# Patient Record
Sex: Female | Born: 1940 | Race: White | Hispanic: No | Marital: Single | Smoking: Current every day smoker
Health system: Southern US, Community
[De-identification: ages and names within clinical notes are randomized; demographics above are authoritative.]

## PROBLEM LIST (undated history)

## (undated) DIAGNOSIS — I509 Heart failure, unspecified: Secondary | ICD-10-CM

## (undated) DIAGNOSIS — I10 Essential (primary) hypertension: Secondary | ICD-10-CM

## (undated) DIAGNOSIS — E079 Disorder of thyroid, unspecified: Secondary | ICD-10-CM

## (undated) DIAGNOSIS — S72009A Fracture of unspecified part of neck of unspecified femur, initial encounter for closed fracture: Secondary | ICD-10-CM

## (undated) DIAGNOSIS — E119 Type 2 diabetes mellitus without complications: Secondary | ICD-10-CM

## (undated) DIAGNOSIS — H539 Unspecified visual disturbance: Secondary | ICD-10-CM

## (undated) HISTORY — PX: ABDOMINAL HYSTERECTOMY: SHX81

## (undated) HISTORY — PX: REPLACEMENT TOTAL KNEE: SUR1224

## (undated) HISTORY — DX: Heart failure, unspecified: I50.9

## (undated) HISTORY — PX: REPLACEMENT TOTAL KNEE BILATERAL: SUR1225

## (undated) HISTORY — DX: Unspecified visual disturbance: H53.9

## (undated) HISTORY — PX: HERNIA REPAIR: SHX51

---

## 2016-10-18 ENCOUNTER — Emergency Department (HOSPITAL_COMMUNITY): Payer: Medicare PPO

## 2016-10-18 ENCOUNTER — Emergency Department (HOSPITAL_COMMUNITY)
Admission: EM | Admit: 2016-10-18 | Discharge: 2016-10-18 | Disposition: A | Discharge status: Home / Self Care | Payer: Medicare PPO | Attending: Emergency Medicine | Admitting: Emergency Medicine

## 2016-10-18 ENCOUNTER — Encounter (HOSPITAL_COMMUNITY): Payer: Self-pay | Admitting: Emergency Medicine

## 2016-10-18 DIAGNOSIS — I10 Essential (primary) hypertension: Secondary | ICD-10-CM | POA: Diagnosis not present

## 2016-10-18 DIAGNOSIS — Y9389 Activity, other specified: Secondary | ICD-10-CM | POA: Diagnosis not present

## 2016-10-18 DIAGNOSIS — S32591D Other specified fracture of right pubis, subsequent encounter for fracture with routine healing: Secondary | ICD-10-CM | POA: Insufficient documentation

## 2016-10-18 DIAGNOSIS — S92515A Nondisplaced fracture of proximal phalanx of left lesser toe(s), initial encounter for closed fracture: Secondary | ICD-10-CM | POA: Diagnosis not present

## 2016-10-18 DIAGNOSIS — S32511D Fracture of superior rim of right pubis, subsequent encounter for fracture with routine healing: Secondary | ICD-10-CM | POA: Diagnosis not present

## 2016-10-18 DIAGNOSIS — Y9289 Other specified places as the place of occurrence of the external cause: Secondary | ICD-10-CM | POA: Insufficient documentation

## 2016-10-18 DIAGNOSIS — E119 Type 2 diabetes mellitus without complications: Secondary | ICD-10-CM | POA: Insufficient documentation

## 2016-10-18 DIAGNOSIS — Z96653 Presence of artificial knee joint, bilateral: Secondary | ICD-10-CM | POA: Insufficient documentation

## 2016-10-18 DIAGNOSIS — F17210 Nicotine dependence, cigarettes, uncomplicated: Secondary | ICD-10-CM | POA: Insufficient documentation

## 2016-10-18 DIAGNOSIS — X58XXXA Exposure to other specified factors, initial encounter: Secondary | ICD-10-CM | POA: Insufficient documentation

## 2016-10-18 DIAGNOSIS — Y999 Unspecified external cause status: Secondary | ICD-10-CM | POA: Insufficient documentation

## 2016-10-18 DIAGNOSIS — S92502A Displaced unspecified fracture of left lesser toe(s), initial encounter for closed fracture: Secondary | ICD-10-CM

## 2016-10-18 DIAGNOSIS — S99922A Unspecified injury of left foot, initial encounter: Secondary | ICD-10-CM | POA: Diagnosis present

## 2016-10-18 HISTORY — DX: Essential (primary) hypertension: I10

## 2016-10-18 HISTORY — DX: Disorder of thyroid, unspecified: E07.9

## 2016-10-18 HISTORY — DX: Fracture of unspecified part of neck of unspecified femur, initial encounter for closed fracture: S72.009A

## 2016-10-18 HISTORY — DX: Type 2 diabetes mellitus without complications: E11.9

## 2016-10-18 MED ORDER — HYDROCODONE-ACETAMINOPHEN 5-325 MG PO TABS
1.0000 | ORAL_TABLET | Freq: Once | ORAL | Status: AC
  Administered 2016-10-18: 1 via ORAL
  Filled 2016-10-18: qty 1

## 2016-10-18 NOTE — ED Triage Notes (Signed)
Pt arrives EMS from home where she was supposed to start physical therapy today for her left hip fracture. Pt noted blueness and coldness at left foot. Blueness noted at top of left foot that does not include distal toes. Left dp found intermittently and much weaker than right dorsal;is pedis pulse.

## 2016-10-18 NOTE — ED Notes (Signed)
Pt verbalized understanding discharge instructions and denies any further needs or questions at this time. VS stable, 

## 2016-10-18 NOTE — ED Provider Notes (Signed)
[facility]EM[facility]ENCY DEPT Provider Note   CSN: [id number] Arrival date & time: 10/18/16  1737     History   Chief Complaint Chief Complaint  Patient presents with  . Foot Pain  . Cold Extremity    HPI Darlene Hernandez is a 75 y.o. female.  75 year old female had fallen 2 weeks ago and was seen and told she had bilateral hairline fractures of her hip. She has been nonweightbearing since then, but has had subsequent falls. Last night, she was noted to have a blue left foot and there was concern for a blood clot so she was told to come here. She states she is taking hydrocodone-acetaminophen for pain limited relief. She has not been able to ambulate. Her daughter states that her left foot was purple last night and is green today. 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   The history is provided by the patient.    Past Medical History:  Diagnosis Date  . Diabetes mellitus without complication (HCC)   . Hip fracture (HCC)   . Hypertension   . Thyroid disease     There are no active problems to display for this patient.   Past Surgical History:  Procedure Laterality Date  . ABDOMINAL HYSTERECTOMY    . HERNIA REPAIR    . REPLACEMENT TOTAL KNEE Right   . REPLACEMENT TOTAL KNEE BILATERAL      OB History    No data available       Home Medications    Prior to Admission medications   Not on File    Family History History reviewed. No pertinent family history.  Social History Social History  Substance Use Topics  . Smoking status: Current Every Day Smoker    Types: E-cigarettes  . Smokeless tobacco: Never Used  . Alcohol use No     Allergies   Penicillins; Sulfa antibiotics; and Prednisone   Review of Systems Review of Systems  All other systems reviewed and are negative.    Physical Exam Updated Vital Signs BP 165/89   Pulse 79   Resp 17   SpO2 92%   Physical Exam  Nursing note and vitals reviewed.  75 year old female, resting comfortably  and in no acute distress. Vital signs are significant for hypertension. Oxygen saturation is 95%, which is normal. Head is normocephalic and atraumatic. PERRLA, EOMI. Oropharynx is clear. Neck is nontender and supple without adenopathy or JVD. Back is nontender and there is no CVA tenderness. Lungs are clear without rales, wheezes, or rhonchi. Chest is nontender. Heart has regular rate and rhythm without murmur. Abdomen is soft, flat, nontender without masses or hepatosplenomegaly and peristalsis is normoactive. Extremities: Ecchymosis and soft tissue swelling is noted over the dorsum of left foot. The left foot is cool to touch but same temperature as the right foot. Capillary refill is delayed to 4 seconds, but this is symmetric compared with the right foot. There is tenderness palpation over the right midfoot. There is pain with palpation over both hips and with passive movement of either hip. Skin is warm and dry without rash. Neurologic: Mental status is normal, cranial nerves are intact, left foot drop is present but no other motor or sensory deficits.  ED Treatments / Results   Radiology Dg Foot Complete Left  Result Date: 10/18/2016 CLINICAL DATA:  Left foot pain. EXAM: LEFT FOOT - COMPLETE 3+ VIEW COMPARISON:  None. FINDINGS: Mildly displaced fractures are seen involving the second, fourth and fifth proximal  phalanges. Mild diffuse osteopenia is noted. No soft tissue abnormality is noted. Joint spaces appear to be intact. IMPRESSION: Mildly displaced second, fourth and fifth proximal phalangeal fractures. Electronically Signed   By: Lupita RaiderJames  Green Jr, M.D.   On: 10/18/2016 19:03   Dg Hips Bilat W Or Wo Pelvis 5 Views  Result Date: 10/18/2016 CLINICAL DATA:  Right hip pain. EXAM: DG HIP (WITH OR WITHOUT PELVIS) 5+V BILAT COMPARISON:  None. FINDINGS: Nondisplaced fractures are seen involving the right superior and inferior pubic rami. Hip joints appear intact. Multilevel degenerative disc  disease is noted in the lumbar spine. IMPRESSION: Nondisplaced right superior and inferior pubic rami fractures. Electronically Signed   By: Lupita RaiderJames  Green Jr, M.D.   On: 10/18/2016 19:07    Procedures Procedures (including critical care time)  Medications Ordered in ED Medications  HYDROcodone-acetaminophen (NORCO/VICODIN) 5-325 MG per tablet 1 tablet (1 tablet Oral Given 10/18/16 1820)     Initial Impression / Assessment and Plan / ED Course  I have reviewed the triage vital signs and the nursing notes.  Pertinent imaging results that were available during my care of the patient were reviewed by me and considered in my medical decision making (see chart for details).  Clinical Course    Left foot findings more suggestive of bruising and possible occult foot fracture then add any vascular injury. Bilateral hip pain. I've tried to find her results on care every where in Epic, but have not been able to find those. I will repeat her hip x-rays today as well as obtain x-ray of her foot.  Hip x-rays actually show nondisplaced fractures of the right superior and inferior pubic rami. Foot x-ray shows fractures of the second, third, fourth toes. These clearly account for her symptoms and findings. Patient is referred to orthopedics for follow-up.  Final Clinical Impressions(s) / ED Diagnoses   Final diagnoses:  Closed fracture of second toe of left foot, initial encounter  Closed fracture of third toe of left foot, initial encounter  Closed fracture of phalanx of left second toe, initial encounter  Closed fracture of right superior pubic ramus with routine healing, subsequent encounter  Closed fracture of right inferior pubic ramus with routine healing, subsequent encounter    New Prescriptions New Prescriptions   No medications on file     Dione Boozeavid Cicely Ortner, MD 10/18/16 2112

## 2016-11-19 DIAGNOSIS — J159 Unspecified bacterial pneumonia: Secondary | ICD-10-CM | POA: Diagnosis not present

## 2016-11-19 DIAGNOSIS — N390 Urinary tract infection, site not specified: Secondary | ICD-10-CM

## 2016-11-19 DIAGNOSIS — I10 Essential (primary) hypertension: Secondary | ICD-10-CM

## 2016-11-19 DIAGNOSIS — R296 Repeated falls: Secondary | ICD-10-CM

## 2016-11-19 DIAGNOSIS — E119 Type 2 diabetes mellitus without complications: Secondary | ICD-10-CM

## 2016-11-19 DIAGNOSIS — M21371 Foot drop, right foot: Secondary | ICD-10-CM

## 2016-11-19 DIAGNOSIS — E785 Hyperlipidemia, unspecified: Secondary | ICD-10-CM

## 2016-11-19 DIAGNOSIS — K219 Gastro-esophageal reflux disease without esophagitis: Secondary | ICD-10-CM

## 2016-11-19 DIAGNOSIS — I959 Hypotension, unspecified: Secondary | ICD-10-CM | POA: Diagnosis not present

## 2016-11-19 DIAGNOSIS — L89159 Pressure ulcer of sacral region, unspecified stage: Secondary | ICD-10-CM

## 2016-11-20 DIAGNOSIS — J159 Unspecified bacterial pneumonia: Secondary | ICD-10-CM | POA: Diagnosis not present

## 2016-11-20 DIAGNOSIS — N390 Urinary tract infection, site not specified: Secondary | ICD-10-CM | POA: Diagnosis not present

## 2016-11-20 DIAGNOSIS — E8770 Fluid overload, unspecified: Secondary | ICD-10-CM

## 2016-11-20 DIAGNOSIS — M21371 Foot drop, right foot: Secondary | ICD-10-CM | POA: Diagnosis not present

## 2016-11-20 DIAGNOSIS — I959 Hypotension, unspecified: Secondary | ICD-10-CM | POA: Diagnosis not present

## 2016-11-21 DIAGNOSIS — I959 Hypotension, unspecified: Secondary | ICD-10-CM | POA: Diagnosis not present

## 2016-11-21 DIAGNOSIS — N390 Urinary tract infection, site not specified: Secondary | ICD-10-CM | POA: Diagnosis not present

## 2016-11-21 DIAGNOSIS — M21371 Foot drop, right foot: Secondary | ICD-10-CM | POA: Diagnosis not present

## 2016-11-21 DIAGNOSIS — J159 Unspecified bacterial pneumonia: Secondary | ICD-10-CM | POA: Diagnosis not present

## 2016-11-24 ENCOUNTER — Ambulatory Visit: Payer: Medicare PPO | Admitting: Neurology

## 2016-12-09 DIAGNOSIS — R600 Localized edema: Secondary | ICD-10-CM | POA: Diagnosis not present

## 2016-12-09 DIAGNOSIS — I10 Essential (primary) hypertension: Secondary | ICD-10-CM

## 2016-12-09 DIAGNOSIS — R9431 Abnormal electrocardiogram [ECG] [EKG]: Secondary | ICD-10-CM

## 2016-12-09 DIAGNOSIS — E119 Type 2 diabetes mellitus without complications: Secondary | ICD-10-CM

## 2016-12-09 DIAGNOSIS — K219 Gastro-esophageal reflux disease without esophagitis: Secondary | ICD-10-CM

## 2016-12-09 DIAGNOSIS — E785 Hyperlipidemia, unspecified: Secondary | ICD-10-CM

## 2016-12-09 DIAGNOSIS — I509 Heart failure, unspecified: Secondary | ICD-10-CM | POA: Diagnosis not present

## 2016-12-10 DIAGNOSIS — R600 Localized edema: Secondary | ICD-10-CM | POA: Diagnosis not present

## 2016-12-10 DIAGNOSIS — R9431 Abnormal electrocardiogram [ECG] [EKG]: Secondary | ICD-10-CM | POA: Diagnosis not present

## 2016-12-10 DIAGNOSIS — I509 Heart failure, unspecified: Secondary | ICD-10-CM | POA: Diagnosis not present

## 2016-12-10 DIAGNOSIS — E119 Type 2 diabetes mellitus without complications: Secondary | ICD-10-CM | POA: Diagnosis not present

## 2016-12-11 DIAGNOSIS — R600 Localized edema: Secondary | ICD-10-CM | POA: Diagnosis not present

## 2016-12-11 DIAGNOSIS — E119 Type 2 diabetes mellitus without complications: Secondary | ICD-10-CM | POA: Diagnosis not present

## 2016-12-11 DIAGNOSIS — I509 Heart failure, unspecified: Secondary | ICD-10-CM | POA: Diagnosis not present

## 2016-12-11 DIAGNOSIS — R9431 Abnormal electrocardiogram [ECG] [EKG]: Secondary | ICD-10-CM | POA: Diagnosis not present

## 2016-12-16 ENCOUNTER — Ambulatory Visit: Payer: Medicare PPO | Admitting: Neurology

## 2017-01-13 ENCOUNTER — Ambulatory Visit (INDEPENDENT_AMBULATORY_CARE_PROVIDER_SITE_OTHER): Payer: Medicare PPO | Admitting: Neurology

## 2017-01-13 ENCOUNTER — Encounter: Payer: Self-pay | Admitting: Neurology

## 2017-01-13 DIAGNOSIS — M21372 Foot drop, left foot: Secondary | ICD-10-CM

## 2017-01-13 DIAGNOSIS — R296 Repeated falls: Secondary | ICD-10-CM | POA: Diagnosis not present

## 2017-01-13 DIAGNOSIS — G629 Polyneuropathy, unspecified: Secondary | ICD-10-CM

## 2017-01-13 DIAGNOSIS — R531 Weakness: Secondary | ICD-10-CM

## 2017-01-13 DIAGNOSIS — M21371 Foot drop, right foot: Secondary | ICD-10-CM | POA: Diagnosis not present

## 2017-01-13 DIAGNOSIS — R200 Anesthesia of skin: Secondary | ICD-10-CM | POA: Diagnosis not present
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
## 2017-01-18 LAB — MULTIPLE MYELOMA PANEL, SERUM
ALBUMIN SERPL ELPH-MCNC: 3.7 g/dL (ref 2.9–4.4)
ALPHA 1: 0.3 g/dL (ref 0.0–0.4)
Albumin/Glob SerPl: 1.2 (ref 0.7–1.7)
Alpha2 Glob SerPl Elph-Mcnc: 0.8 g/dL (ref 0.4–1.0)
B-Globulin SerPl Elph-Mcnc: 1.2 g/dL (ref 0.7–1.3)
Gamma Glob SerPl Elph-Mcnc: 1 g/dL (ref 0.4–1.8)
Globulin, Total: 3.2 (ref 2.2–3.9)
IGA/IMMUNOGLOBULIN A, SERUM: 319 mg/dL (ref 64–422)
IGM (IMMUNOGLOBULIN M), SRM: 153 mg/dL (ref 26–217)
IgG (Immunoglobin G), Serum: 1075 mg/dL (ref 700–1600)
TOTAL PROTEIN: 6.9 g/dL (ref 6.0–8.5)

## 2017-01-18 LAB — ENA+DNA/DS+SJORGEN'S
ENA RNP Ab: 1.3 AI — ABNORMAL HIGH (ref 0.0–0.9)
ENA SSB (LA) Ab: 1.5 AI — ABNORMAL HIGH (ref 0.0–0.9)
dsDNA Ab: <1 IU/mL (ref 0–9)

## 2017-01-18 LAB — ANA W/REFLEX: ANA: POSITIVE — AB

## 2017-01-18 LAB — SEDIMENTATION RATE: SED RATE: 23 mm/h (ref 0–40)

## 2017-01-18 LAB — VITAMIN B12: VITAMIN B 12: 787 pg/mL (ref 232–1245)

## 2017-01-18 LAB — RHEUMATOID FACTOR: Rhuematoid fact SerPl-aCnc: <10 IU/mL (ref 0.0–13.9)

## 2017-01-19 ENCOUNTER — Ambulatory Visit
Admission: RE | Admit: 2017-01-19 | Discharge: 2017-01-19 | Disposition: A | Discharge status: Home / Self Care | Payer: Medicare PPO | Source: Ambulatory Visit | Attending: Neurology | Admitting: Neurology

## 2017-01-19 DIAGNOSIS — G629 Polyneuropathy, unspecified: Secondary | ICD-10-CM

## 2017-01-26 ENCOUNTER — Telehealth: Payer: Self-pay | Admitting: *Deleted

## 2017-01-26 DIAGNOSIS — R899 Unspecified abnormal finding in specimens from other organs, systems and tissues: Secondary | ICD-10-CM

## 2017-01-26 DIAGNOSIS — G629 Polyneuropathy, unspecified: Secondary | ICD-10-CM

## 2017-01-26 NOTE — Telephone Encounter (Signed)
I have spoken with Darlene Hernandez this morning and per RAS, reviewed MRI an lab results as below.  She verbalized understanding of same, is agreeable to rheumatology consult.  Order in EPIC/fim

## 2017-01-26 NOTE — Telephone Encounter (Signed)
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 2)   the labwork showed that she might have a rheumatologic disorder and that might be causing her neuropathy. I would like her to see a rheumatologist

## 2017-01-27 NOTE — Telephone Encounter (Signed)
Noted Referral has been sent.  °

## 2017-02-21 ENCOUNTER — Ambulatory Visit (INDEPENDENT_AMBULATORY_CARE_PROVIDER_SITE_OTHER): Payer: Self-pay | Admitting: Neurology

## 2017-02-21 ENCOUNTER — Encounter (INDEPENDENT_AMBULATORY_CARE_PROVIDER_SITE_OTHER): Payer: Self-pay

## 2017-02-21 ENCOUNTER — Encounter: Payer: Self-pay | Admitting: Neurology

## 2017-02-21 ENCOUNTER — Ambulatory Visit (INDEPENDENT_AMBULATORY_CARE_PROVIDER_SITE_OTHER): Payer: Medicare PPO | Admitting: Neurology

## 2017-02-21 DIAGNOSIS — M21371 Foot drop, right foot: Secondary | ICD-10-CM | POA: Diagnosis not present

## 2017-02-21 DIAGNOSIS — M21372 Foot drop, left foot: Secondary | ICD-10-CM

## 2017-02-21 DIAGNOSIS — G629 Polyneuropathy, unspecified: Secondary | ICD-10-CM

## 2017-02-21 NOTE — Progress Notes (Signed)
Please refer to EMG and nerve conduction study procedure note. 

## 2017-02-21 NOTE — Procedures (Signed)
     HISTORY:  Darlene Hernandez is a 76 year old white female with a progressive gait disorder associated with numbness and weakness of both legs, and associated with bilateral foot drops. The patient is evaluated for possible peripheral neuropathy. She has known severe spinal stenosis as well at the L3-4 level.  NERVE CONDUCTION STUDIES:  Nerve conduction studies were performed on the left upper extremity. The distal motor latency for the left median nerve was prolonged, with a normal motor amplitude. The distal motor latency for the left ulnar nerve was prolonged with a normal motor amplitude. The nerve conduction velocities for the left median and ulnar nerves were normal. The sensory latency for the left ulnar nerve was unobtainable, and was prolonged for the left median nerve. The F wave latencies for the left median and ulnar nerves were prolonged.  Nerve conduction studies were performed on both lower extremities. The studies of the peroneal and posterior tibial nerves were unobtainable bilaterally. The sural and peroneal sensory latencies were unobtainable bilaterally. The H reflex latencies were unobtainable bilaterally.  EMG STUDIES:  EMG study was performed on the left lower extremity:  The tibialis anterior muscle reveals no voluntary recruitment. No fibrillations or positive waves were seen. The peroneus tertius muscle reveals no voluntary recruitment. No fibrillations or positive waves were seen. The medial gastrocnemius muscle reveals no voluntary full recruitment. No fibrillations or positive waves were seen. The vastus lateralis muscle reveals 2 to 5K motor units with decreased recruitment. No fibrillations or positive waves were seen. The iliopsoas muscle reveals 2 to 5K motor units with decreased recruitment. No fibrillations or positive waves were seen. The biceps femoris muscle (long head) reveals 2 to 4K motor units with slightly decreased recruitment. No fibrillations or  positive waves were seen. The lumbosacral paraspinal muscles were tested at 3 levels, and revealed no abnormalities of insertional activity at all 3 levels tested. There was good relaxation.   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 EMG evaluation of the left lower extremity shows no viable muscle tissue in muscles tested below the knee, with chronic stable changes above the knee. The study is consistent with a severe end-stage peripheral neuropathy, some of the chronic changes seen more proximally may be related to the lumbosacral spinal stenosis affecting the L3 and L4 nerve roots. No acute denervation was seen.  [name]. Keith Jethro Radke MD 02/21/2017 4:02 PM  [facility] [address] [place], [place] [id number]  Phone [phone] Fax [phone]

## 2017-02-21 NOTE — Progress Notes (Signed)
A prescription was given today for bilateral AFO braces, the patient is being followed through the [facility] [place]. The telephone number there is [phone]. The fax number is [phone].   The report of the EMG study and the prescription for the AFO brace will be faxed to the [facility].

## 2017-02-24 ENCOUNTER — Telehealth: Payer: Self-pay | Admitting: *Deleted

## 2017-02-24 NOTE — Telephone Encounter (Signed)
I have spoken with [name] this afternoon, and per RAS, reviewed results with her as below.  She verbalized understanding of same, sts. she has pending appt. with rheumatology in April./fim

## 2017-02-24 NOTE — Telephone Encounter (Signed)
-----   Message from Asa Lenteichard A Sater, MD sent at 02/22/2017  9:37 AM EDT ----- Dr. Anne HahnWillis did her EMG yesterday. She has a very severe polyneuropathy.     It looks like he gave her a prescription for AFO braces (for [facility]) which may help. I did want her to see rheumatology as some of her lab work was abnormal and that could be playing a role (it looks like you sent in the referral 01/26/2017, hopefully she is scheduled with them)

## 2017-03-24 ENCOUNTER — Telehealth: Payer: Self-pay | Admitting: Neurology

## 2017-03-24 NOTE — Telephone Encounter (Signed)
Noted/fim 

## 2017-03-24 NOTE — Telephone Encounter (Signed)
Patient has Cx. Her apt with [facility]. [phone].

## 2017-08-04 DIAGNOSIS — M21371 Foot drop, right foot: Secondary | ICD-10-CM | POA: Diagnosis not present

## 2017-08-04 DIAGNOSIS — M21372 Foot drop, left foot: Secondary | ICD-10-CM | POA: Diagnosis not present

## 2017-08-12 DIAGNOSIS — I5043 Acute on chronic combined systolic (congestive) and diastolic (congestive) heart failure: Secondary | ICD-10-CM | POA: Diagnosis not present

## 2017-08-16 DIAGNOSIS — I10 Essential (primary) hypertension: Secondary | ICD-10-CM | POA: Diagnosis not present

## 2017-08-16 DIAGNOSIS — E0590 Thyrotoxicosis, unspecified without thyrotoxic crisis or storm: Secondary | ICD-10-CM | POA: Diagnosis not present

## 2017-08-16 DIAGNOSIS — J309 Allergic rhinitis, unspecified: Secondary | ICD-10-CM | POA: Diagnosis not present

## 2017-08-16 DIAGNOSIS — E119 Type 2 diabetes mellitus without complications: Secondary | ICD-10-CM | POA: Diagnosis not present

## 2017-08-16 DIAGNOSIS — E785 Hyperlipidemia, unspecified: Secondary | ICD-10-CM | POA: Diagnosis not present

## 2017-08-16 DIAGNOSIS — Z79899 Other long term (current) drug therapy: Secondary | ICD-10-CM | POA: Diagnosis not present

## 2017-08-16 DIAGNOSIS — Z6830 Body mass index (BMI) 30.0-30.9, adult: Secondary | ICD-10-CM | POA: Diagnosis not present

## 2017-08-16 DIAGNOSIS — G4700 Insomnia, unspecified: Secondary | ICD-10-CM | POA: Diagnosis not present

## 2017-09-11 DIAGNOSIS — I5043 Acute on chronic combined systolic (congestive) and diastolic (congestive) heart failure: Secondary | ICD-10-CM | POA: Diagnosis not present

## 2017-10-12 DIAGNOSIS — I5043 Acute on chronic combined systolic (congestive) and diastolic (congestive) heart failure: Secondary | ICD-10-CM | POA: Diagnosis not present

## 2017-11-11 DIAGNOSIS — I5043 Acute on chronic combined systolic (congestive) and diastolic (congestive) heart failure: Secondary | ICD-10-CM | POA: Diagnosis not present

## 2017-11-15 DIAGNOSIS — R6889 Other general symptoms and signs: Secondary | ICD-10-CM | POA: Diagnosis not present

## 2017-11-15 DIAGNOSIS — Z23 Encounter for immunization: Secondary | ICD-10-CM | POA: Diagnosis not present

## 2017-11-15 DIAGNOSIS — F419 Anxiety disorder, unspecified: Secondary | ICD-10-CM | POA: Diagnosis not present

## 2017-11-15 DIAGNOSIS — J449 Chronic obstructive pulmonary disease, unspecified: Secondary | ICD-10-CM | POA: Diagnosis not present

## 2017-11-15 DIAGNOSIS — E119 Type 2 diabetes mellitus without complications: Secondary | ICD-10-CM | POA: Diagnosis not present

## 2017-11-15 DIAGNOSIS — G4700 Insomnia, unspecified: Secondary | ICD-10-CM | POA: Diagnosis not present

## 2017-11-15 DIAGNOSIS — E785 Hyperlipidemia, unspecified: Secondary | ICD-10-CM | POA: Diagnosis not present

## 2017-11-15 DIAGNOSIS — Z79899 Other long term (current) drug therapy: Secondary | ICD-10-CM | POA: Diagnosis not present

## 2017-11-15 DIAGNOSIS — E0590 Thyrotoxicosis, unspecified without thyrotoxic crisis or storm: Secondary | ICD-10-CM | POA: Diagnosis not present

## 2017-12-12 DIAGNOSIS — I5043 Acute on chronic combined systolic (congestive) and diastolic (congestive) heart failure: Secondary | ICD-10-CM | POA: Diagnosis not present

## 2018-01-04 IMAGING — DX DG HIP (WITH OR WITHOUT PELVIS) 5+V BILAT
6 series · 6 of 6 positions shown · non-contrast
Comparison: None.

CLINICAL DATA: Right hip pain.

EXAM:
DG HIP (WITH OR WITHOUT PELVIS) 5+V BILAT

[pelvis ap (1 of 2)]
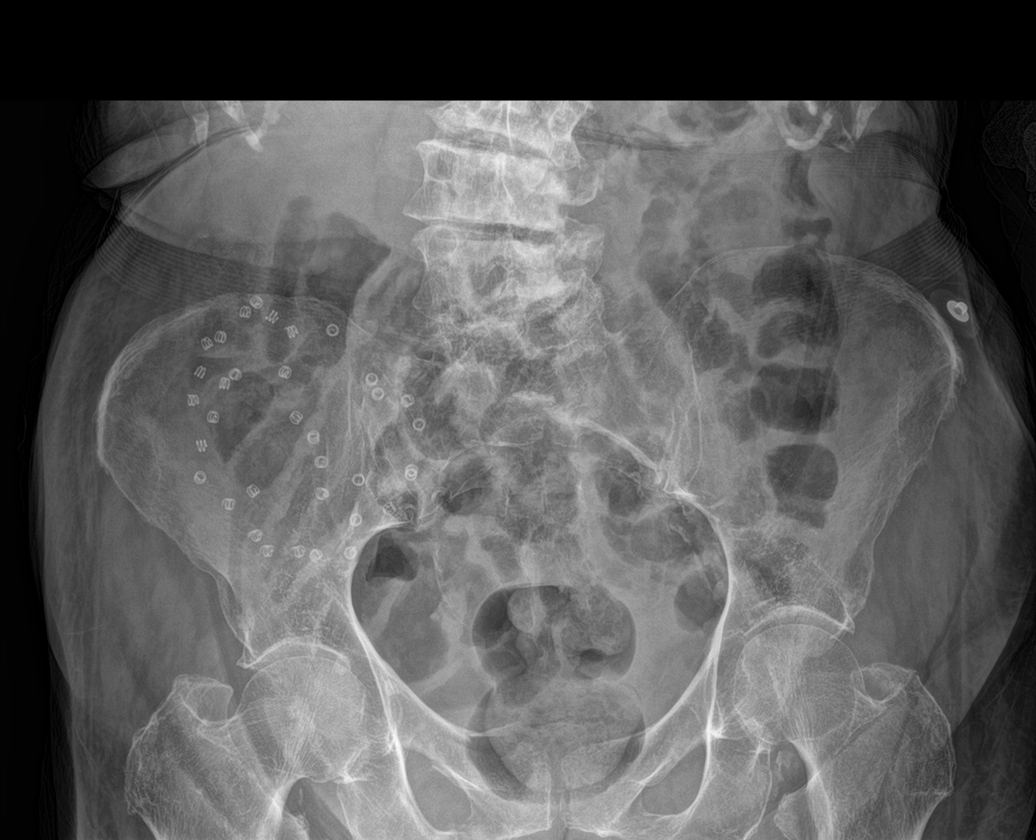

[hip ap (1 of 2)]
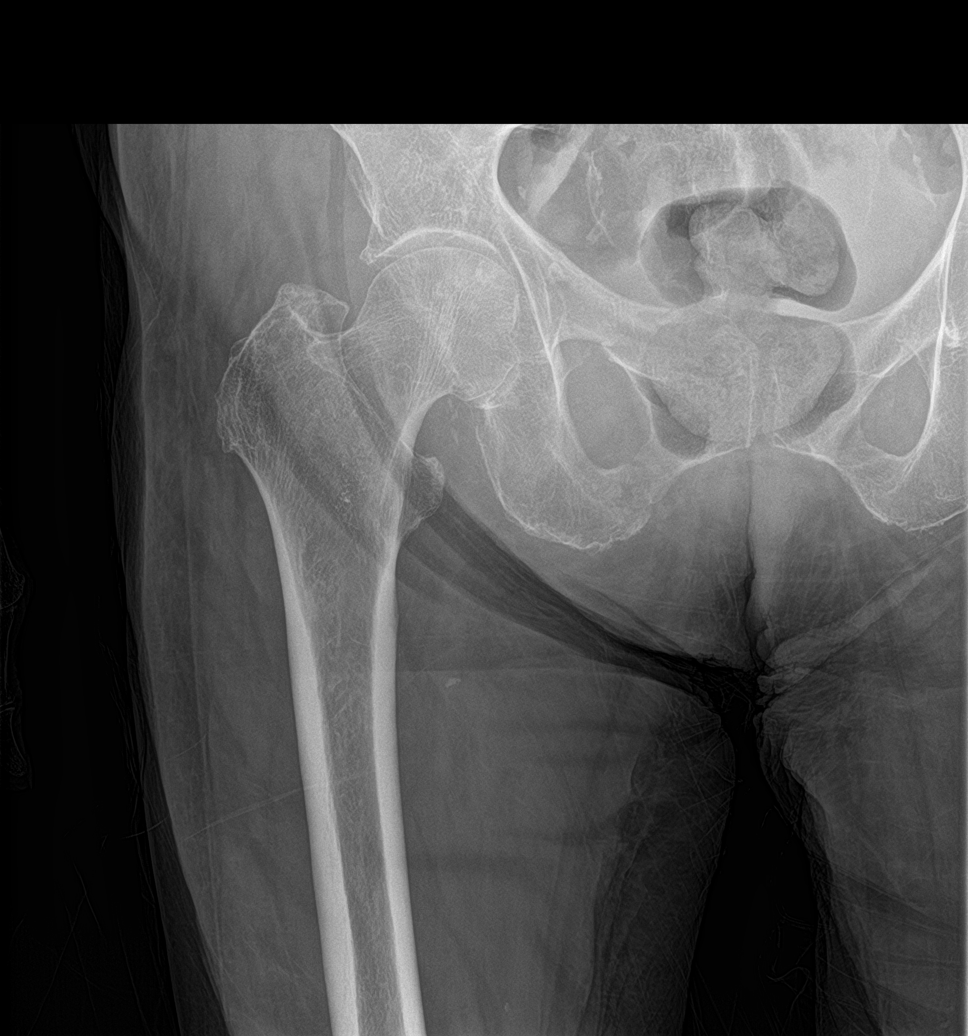

[hip frog leg (1 of 2)]
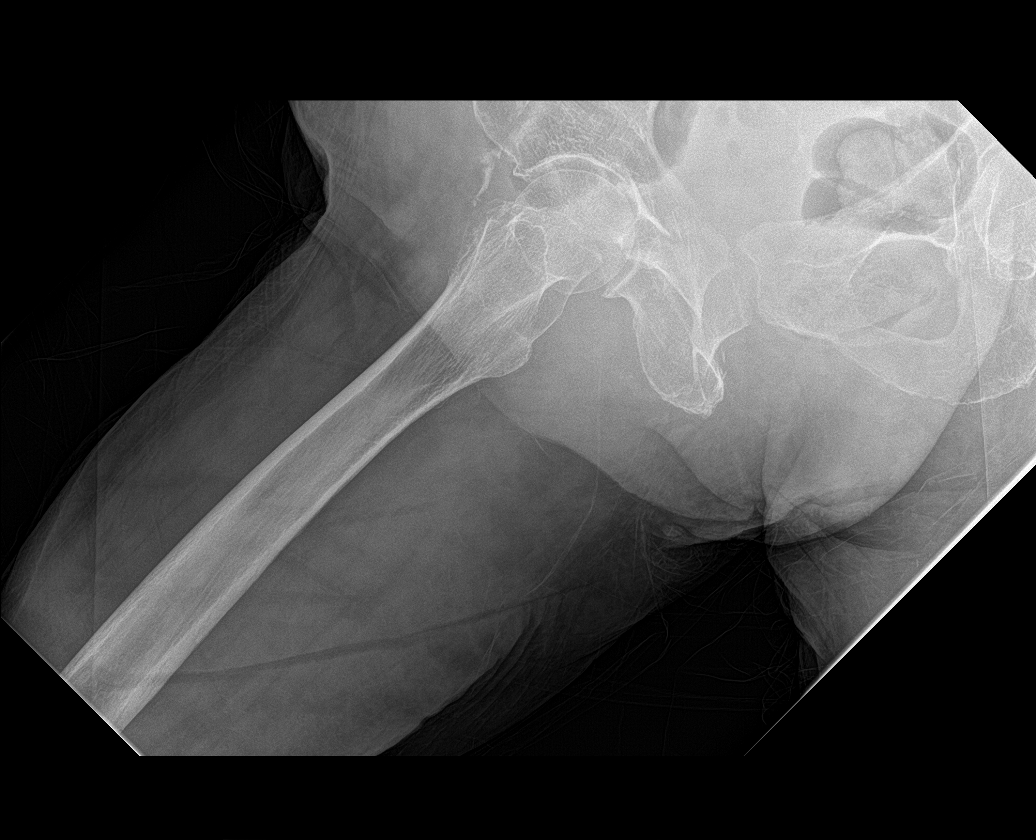

[hip ap (2 of 2)]
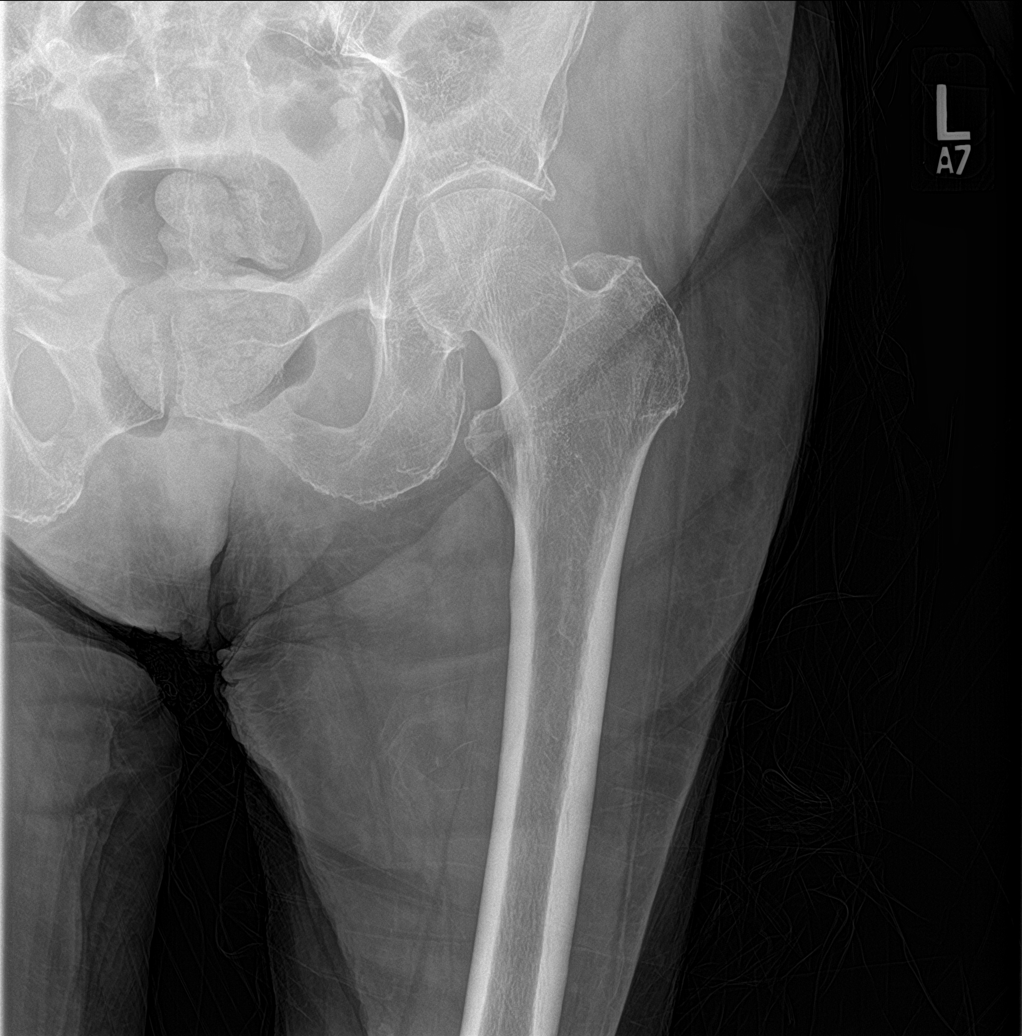

[hip frog leg (2 of 2)]
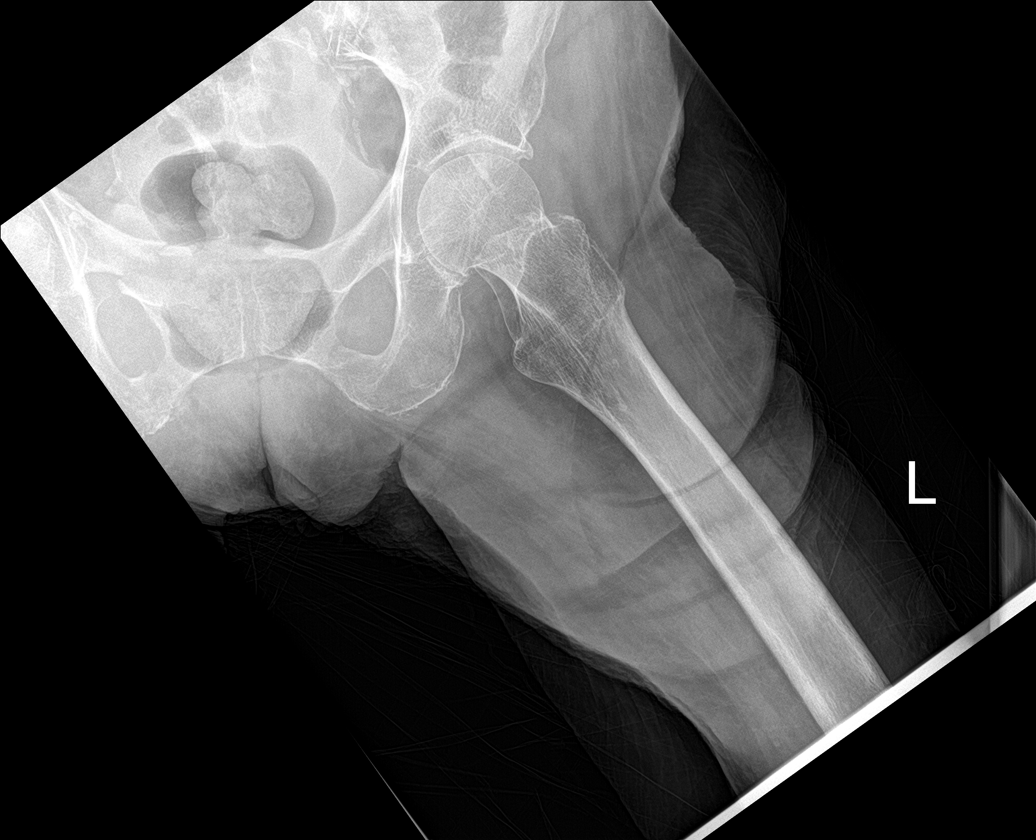

[pelvis ap (2 of 2)]
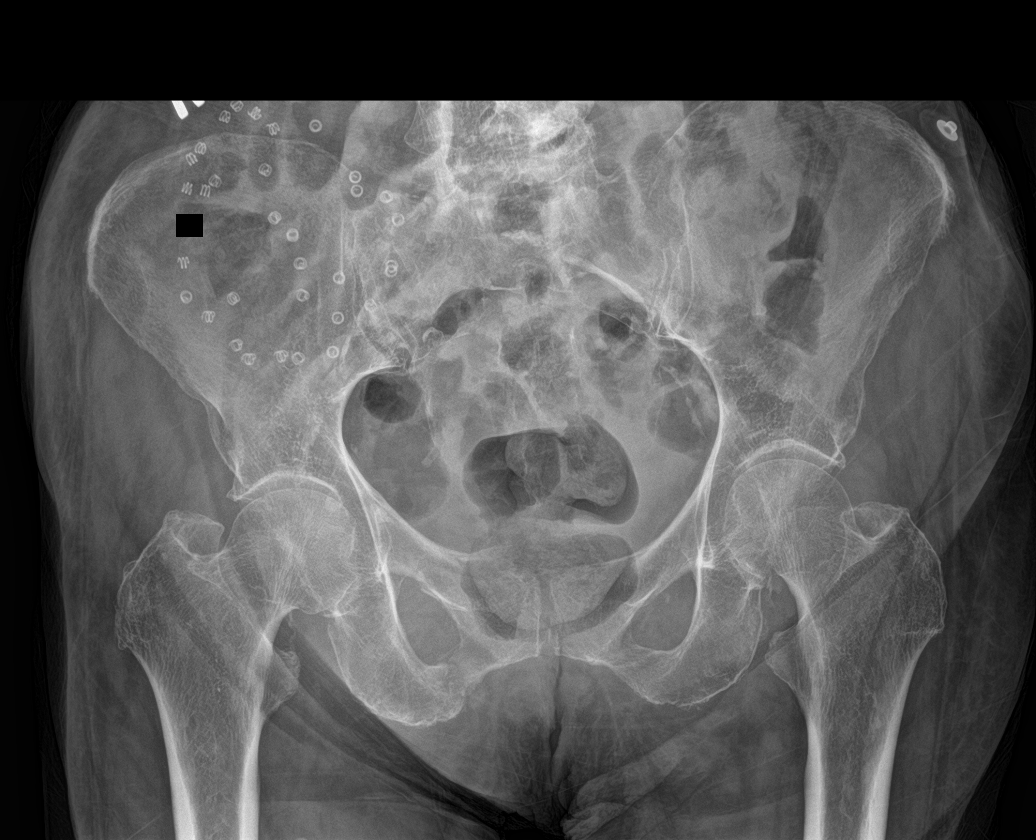

[6 of 6 positions shown; findings below may reference images not displayed]

FINDINGS: Nondisplaced fractures are seen involving the right superior and
inferior pubic rami. Hip joints appear intact. Multilevel
degenerative disc disease is noted in the lumbar spine.
IMPRESSION: Nondisplaced right superior and inferior pubic rami fractures.

## 2018-01-12 DIAGNOSIS — I5043 Acute on chronic combined systolic (congestive) and diastolic (congestive) heart failure: Secondary | ICD-10-CM | POA: Diagnosis not present

## 2018-01-17 DIAGNOSIS — H25813 Combined forms of age-related cataract, bilateral: Secondary | ICD-10-CM | POA: Diagnosis not present

## 2018-01-17 DIAGNOSIS — H35363 Drusen (degenerative) of macula, bilateral: Secondary | ICD-10-CM | POA: Diagnosis not present

## 2018-02-09 DIAGNOSIS — I5043 Acute on chronic combined systolic (congestive) and diastolic (congestive) heart failure: Secondary | ICD-10-CM | POA: Diagnosis not present

## 2018-03-01 DIAGNOSIS — E0590 Thyrotoxicosis, unspecified without thyrotoxic crisis or storm: Secondary | ICD-10-CM | POA: Diagnosis not present

## 2018-03-01 DIAGNOSIS — E785 Hyperlipidemia, unspecified: Secondary | ICD-10-CM | POA: Diagnosis not present

## 2018-03-01 DIAGNOSIS — Z6833 Body mass index (BMI) 33.0-33.9, adult: Secondary | ICD-10-CM | POA: Diagnosis not present

## 2018-03-01 DIAGNOSIS — E119 Type 2 diabetes mellitus without complications: Secondary | ICD-10-CM | POA: Diagnosis not present

## 2018-03-01 DIAGNOSIS — J449 Chronic obstructive pulmonary disease, unspecified: Secondary | ICD-10-CM | POA: Diagnosis not present

## 2018-03-01 DIAGNOSIS — I10 Essential (primary) hypertension: Secondary | ICD-10-CM | POA: Diagnosis not present

## 2018-03-01 DIAGNOSIS — Z1331 Encounter for screening for depression: Secondary | ICD-10-CM | POA: Diagnosis not present

## 2018-03-01 DIAGNOSIS — Z79899 Other long term (current) drug therapy: Secondary | ICD-10-CM | POA: Diagnosis not present

## 2018-03-12 DIAGNOSIS — I5043 Acute on chronic combined systolic (congestive) and diastolic (congestive) heart failure: Secondary | ICD-10-CM | POA: Diagnosis not present

## 2018-04-11 DIAGNOSIS — I5043 Acute on chronic combined systolic (congestive) and diastolic (congestive) heart failure: Secondary | ICD-10-CM | POA: Diagnosis not present

## 2018-05-12 DIAGNOSIS — I5043 Acute on chronic combined systolic (congestive) and diastolic (congestive) heart failure: Secondary | ICD-10-CM | POA: Diagnosis not present

## 2018-06-11 DIAGNOSIS — I5043 Acute on chronic combined systolic (congestive) and diastolic (congestive) heart failure: Secondary | ICD-10-CM | POA: Diagnosis not present

## 2018-07-12 DIAGNOSIS — I5043 Acute on chronic combined systolic (congestive) and diastolic (congestive) heart failure: Secondary | ICD-10-CM | POA: Diagnosis not present

## 2018-07-17 DIAGNOSIS — H35363 Drusen (degenerative) of macula, bilateral: Secondary | ICD-10-CM | POA: Diagnosis not present

## 2018-07-17 DIAGNOSIS — H25813 Combined forms of age-related cataract, bilateral: Secondary | ICD-10-CM | POA: Diagnosis not present

## 2018-07-17 DIAGNOSIS — H40013 Open angle with borderline findings, low risk, bilateral: Secondary | ICD-10-CM | POA: Diagnosis not present

## 2018-08-31 DIAGNOSIS — Z79899 Other long term (current) drug therapy: Secondary | ICD-10-CM | POA: Diagnosis not present

## 2018-08-31 DIAGNOSIS — Z6833 Body mass index (BMI) 33.0-33.9, adult: Secondary | ICD-10-CM | POA: Diagnosis not present

## 2018-08-31 DIAGNOSIS — E785 Hyperlipidemia, unspecified: Secondary | ICD-10-CM | POA: Diagnosis not present

## 2018-08-31 DIAGNOSIS — J440 Chronic obstructive pulmonary disease with acute lower respiratory infection: Secondary | ICD-10-CM | POA: Diagnosis not present

## 2018-08-31 DIAGNOSIS — I10 Essential (primary) hypertension: Secondary | ICD-10-CM | POA: Diagnosis not present

## 2018-08-31 DIAGNOSIS — R0602 Shortness of breath: Secondary | ICD-10-CM | POA: Diagnosis not present

## 2018-08-31 DIAGNOSIS — E559 Vitamin D deficiency, unspecified: Secondary | ICD-10-CM | POA: Diagnosis not present

## 2018-08-31 DIAGNOSIS — R05 Cough: Secondary | ICD-10-CM | POA: Diagnosis not present

## 2018-08-31 DIAGNOSIS — Z1339 Encounter for screening examination for other mental health and behavioral disorders: Secondary | ICD-10-CM | POA: Diagnosis not present

## 2018-08-31 DIAGNOSIS — M21371 Foot drop, right foot: Secondary | ICD-10-CM | POA: Diagnosis not present

## 2018-08-31 DIAGNOSIS — G629 Polyneuropathy, unspecified: Secondary | ICD-10-CM | POA: Diagnosis not present

## 2018-08-31 DIAGNOSIS — Z9181 History of falling: Secondary | ICD-10-CM | POA: Diagnosis not present

## 2018-08-31 DIAGNOSIS — K219 Gastro-esophageal reflux disease without esophagitis: Secondary | ICD-10-CM | POA: Diagnosis not present

## 2018-08-31 DIAGNOSIS — Z139 Encounter for screening, unspecified: Secondary | ICD-10-CM | POA: Diagnosis not present

## 2018-09-06 DIAGNOSIS — I87311 Chronic venous hypertension (idiopathic) with ulcer of right lower extremity: Secondary | ICD-10-CM | POA: Diagnosis not present

## 2018-09-06 DIAGNOSIS — L97312 Non-pressure chronic ulcer of right ankle with fat layer exposed: Secondary | ICD-10-CM | POA: Diagnosis not present

## 2018-09-06 DIAGNOSIS — I87302 Chronic venous hypertension (idiopathic) without complications of left lower extremity: Secondary | ICD-10-CM | POA: Diagnosis not present

## 2018-09-06 DIAGNOSIS — J449 Chronic obstructive pulmonary disease, unspecified: Secondary | ICD-10-CM | POA: Diagnosis not present

## 2018-09-06 DIAGNOSIS — I509 Heart failure, unspecified: Secondary | ICD-10-CM | POA: Diagnosis not present

## 2018-09-06 DIAGNOSIS — L97812 Non-pressure chronic ulcer of other part of right lower leg with fat layer exposed: Secondary | ICD-10-CM | POA: Diagnosis not present

## 2018-09-06 DIAGNOSIS — I119 Hypertensive heart disease without heart failure: Secondary | ICD-10-CM | POA: Diagnosis not present

## 2018-09-06 DIAGNOSIS — G629 Polyneuropathy, unspecified: Secondary | ICD-10-CM | POA: Diagnosis not present

## 2018-09-06 DIAGNOSIS — Z87891 Personal history of nicotine dependence: Secondary | ICD-10-CM | POA: Diagnosis not present

## 2018-09-07 DIAGNOSIS — Z23 Encounter for immunization: Secondary | ICD-10-CM | POA: Diagnosis not present

## 2018-09-07 DIAGNOSIS — F419 Anxiety disorder, unspecified: Secondary | ICD-10-CM | POA: Diagnosis not present

## 2018-09-07 DIAGNOSIS — N952 Postmenopausal atrophic vaginitis: Secondary | ICD-10-CM | POA: Diagnosis not present

## 2018-09-07 DIAGNOSIS — Z6833 Body mass index (BMI) 33.0-33.9, adult: Secondary | ICD-10-CM | POA: Diagnosis not present

## 2018-09-07 DIAGNOSIS — I10 Essential (primary) hypertension: Secondary | ICD-10-CM | POA: Diagnosis not present

## 2018-09-07 DIAGNOSIS — J309 Allergic rhinitis, unspecified: Secondary | ICD-10-CM | POA: Diagnosis not present

## 2018-09-07 DIAGNOSIS — J449 Chronic obstructive pulmonary disease, unspecified: Secondary | ICD-10-CM | POA: Diagnosis not present

## 2018-09-07 DIAGNOSIS — E559 Vitamin D deficiency, unspecified: Secondary | ICD-10-CM | POA: Diagnosis not present

## 2018-09-07 DIAGNOSIS — L97919 Non-pressure chronic ulcer of unspecified part of right lower leg with unspecified severity: Secondary | ICD-10-CM | POA: Diagnosis not present

## 2018-09-11 DIAGNOSIS — I87311 Chronic venous hypertension (idiopathic) with ulcer of right lower extremity: Secondary | ICD-10-CM | POA: Diagnosis not present

## 2018-09-11 DIAGNOSIS — L97312 Non-pressure chronic ulcer of right ankle with fat layer exposed: Secondary | ICD-10-CM | POA: Diagnosis not present

## 2018-09-11 DIAGNOSIS — I872 Venous insufficiency (chronic) (peripheral): Secondary | ICD-10-CM | POA: Diagnosis not present

## 2018-09-11 DIAGNOSIS — L97812 Non-pressure chronic ulcer of other part of right lower leg with fat layer exposed: Secondary | ICD-10-CM | POA: Diagnosis not present

## 2018-09-22 DIAGNOSIS — I87311 Chronic venous hypertension (idiopathic) with ulcer of right lower extremity: Secondary | ICD-10-CM | POA: Diagnosis not present

## 2018-09-22 DIAGNOSIS — J449 Chronic obstructive pulmonary disease, unspecified: Secondary | ICD-10-CM | POA: Diagnosis not present

## 2018-09-22 DIAGNOSIS — L97812 Non-pressure chronic ulcer of other part of right lower leg with fat layer exposed: Secondary | ICD-10-CM | POA: Diagnosis not present

## 2018-09-22 DIAGNOSIS — I87302 Chronic venous hypertension (idiopathic) without complications of left lower extremity: Secondary | ICD-10-CM | POA: Diagnosis not present

## 2018-09-22 DIAGNOSIS — I10 Essential (primary) hypertension: Secondary | ICD-10-CM | POA: Diagnosis not present

## 2018-09-22 DIAGNOSIS — L97312 Non-pressure chronic ulcer of right ankle with fat layer exposed: Secondary | ICD-10-CM | POA: Diagnosis not present

## 2018-09-29 DIAGNOSIS — I87311 Chronic venous hypertension (idiopathic) with ulcer of right lower extremity: Secondary | ICD-10-CM | POA: Diagnosis not present

## 2018-09-29 DIAGNOSIS — I10 Essential (primary) hypertension: Secondary | ICD-10-CM | POA: Diagnosis not present

## 2018-09-29 DIAGNOSIS — J449 Chronic obstructive pulmonary disease, unspecified: Secondary | ICD-10-CM | POA: Diagnosis not present

## 2018-09-29 DIAGNOSIS — L97812 Non-pressure chronic ulcer of other part of right lower leg with fat layer exposed: Secondary | ICD-10-CM | POA: Diagnosis not present

## 2018-09-29 DIAGNOSIS — L97312 Non-pressure chronic ulcer of right ankle with fat layer exposed: Secondary | ICD-10-CM | POA: Diagnosis not present

## 2018-09-29 DIAGNOSIS — I87313 Chronic venous hypertension (idiopathic) with ulcer of bilateral lower extremity: Secondary | ICD-10-CM | POA: Diagnosis not present

## 2018-09-29 DIAGNOSIS — I87302 Chronic venous hypertension (idiopathic) without complications of left lower extremity: Secondary | ICD-10-CM | POA: Diagnosis not present

## 2018-10-06 DIAGNOSIS — I87311 Chronic venous hypertension (idiopathic) with ulcer of right lower extremity: Secondary | ICD-10-CM | POA: Diagnosis not present

## 2018-10-06 DIAGNOSIS — L97812 Non-pressure chronic ulcer of other part of right lower leg with fat layer exposed: Secondary | ICD-10-CM | POA: Diagnosis not present

## 2018-10-06 DIAGNOSIS — L97312 Non-pressure chronic ulcer of right ankle with fat layer exposed: Secondary | ICD-10-CM | POA: Diagnosis not present

## 2018-10-06 DIAGNOSIS — I87302 Chronic venous hypertension (idiopathic) without complications of left lower extremity: Secondary | ICD-10-CM | POA: Diagnosis not present

## 2018-10-06 DIAGNOSIS — J449 Chronic obstructive pulmonary disease, unspecified: Secondary | ICD-10-CM | POA: Diagnosis not present

## 2018-10-06 DIAGNOSIS — I10 Essential (primary) hypertension: Secondary | ICD-10-CM | POA: Diagnosis not present

## 2018-10-13 DIAGNOSIS — J449 Chronic obstructive pulmonary disease, unspecified: Secondary | ICD-10-CM | POA: Diagnosis not present

## 2018-10-13 DIAGNOSIS — L97312 Non-pressure chronic ulcer of right ankle with fat layer exposed: Secondary | ICD-10-CM | POA: Diagnosis not present

## 2018-10-13 DIAGNOSIS — I87302 Chronic venous hypertension (idiopathic) without complications of left lower extremity: Secondary | ICD-10-CM | POA: Diagnosis not present

## 2018-10-13 DIAGNOSIS — I10 Essential (primary) hypertension: Secondary | ICD-10-CM | POA: Diagnosis not present

## 2018-10-13 DIAGNOSIS — I87311 Chronic venous hypertension (idiopathic) with ulcer of right lower extremity: Secondary | ICD-10-CM | POA: Diagnosis not present

## 2018-10-20 DIAGNOSIS — L97312 Non-pressure chronic ulcer of right ankle with fat layer exposed: Secondary | ICD-10-CM | POA: Diagnosis not present

## 2018-10-20 DIAGNOSIS — I87302 Chronic venous hypertension (idiopathic) without complications of left lower extremity: Secondary | ICD-10-CM | POA: Diagnosis not present

## 2018-10-20 DIAGNOSIS — I10 Essential (primary) hypertension: Secondary | ICD-10-CM | POA: Diagnosis not present

## 2018-10-20 DIAGNOSIS — I87311 Chronic venous hypertension (idiopathic) with ulcer of right lower extremity: Secondary | ICD-10-CM | POA: Diagnosis not present

## 2018-10-20 DIAGNOSIS — J449 Chronic obstructive pulmonary disease, unspecified: Secondary | ICD-10-CM | POA: Diagnosis not present

## 2018-10-24 DIAGNOSIS — I87302 Chronic venous hypertension (idiopathic) without complications of left lower extremity: Secondary | ICD-10-CM | POA: Diagnosis not present

## 2018-10-24 DIAGNOSIS — L97312 Non-pressure chronic ulcer of right ankle with fat layer exposed: Secondary | ICD-10-CM | POA: Diagnosis not present

## 2018-10-24 DIAGNOSIS — I87311 Chronic venous hypertension (idiopathic) with ulcer of right lower extremity: Secondary | ICD-10-CM | POA: Diagnosis not present

## 2018-10-30 DIAGNOSIS — I10 Essential (primary) hypertension: Secondary | ICD-10-CM | POA: Diagnosis not present

## 2018-10-30 DIAGNOSIS — L97312 Non-pressure chronic ulcer of right ankle with fat layer exposed: Secondary | ICD-10-CM | POA: Diagnosis not present

## 2018-10-30 DIAGNOSIS — I87311 Chronic venous hypertension (idiopathic) with ulcer of right lower extremity: Secondary | ICD-10-CM | POA: Diagnosis not present

## 2018-10-30 DIAGNOSIS — J449 Chronic obstructive pulmonary disease, unspecified: Secondary | ICD-10-CM | POA: Diagnosis not present

## 2018-10-30 DIAGNOSIS — I87302 Chronic venous hypertension (idiopathic) without complications of left lower extremity: Secondary | ICD-10-CM | POA: Diagnosis not present

## 2018-11-07 DIAGNOSIS — L97319 Non-pressure chronic ulcer of right ankle with unspecified severity: Secondary | ICD-10-CM | POA: Diagnosis not present

## 2018-11-07 DIAGNOSIS — Z872 Personal history of diseases of the skin and subcutaneous tissue: Secondary | ICD-10-CM | POA: Diagnosis not present

## 2018-11-07 DIAGNOSIS — I87311 Chronic venous hypertension (idiopathic) with ulcer of right lower extremity: Secondary | ICD-10-CM | POA: Diagnosis not present

## 2018-11-07 DIAGNOSIS — Z09 Encounter for follow-up examination after completed treatment for conditions other than malignant neoplasm: Secondary | ICD-10-CM | POA: Diagnosis not present

## 2018-11-07 DIAGNOSIS — Z8679 Personal history of other diseases of the circulatory system: Secondary | ICD-10-CM | POA: Diagnosis not present

## 2018-12-13 DIAGNOSIS — E785 Hyperlipidemia, unspecified: Secondary | ICD-10-CM | POA: Diagnosis not present

## 2018-12-13 DIAGNOSIS — G4700 Insomnia, unspecified: Secondary | ICD-10-CM | POA: Diagnosis not present

## 2018-12-13 DIAGNOSIS — F419 Anxiety disorder, unspecified: Secondary | ICD-10-CM | POA: Diagnosis not present

## 2018-12-13 DIAGNOSIS — Z6833 Body mass index (BMI) 33.0-33.9, adult: Secondary | ICD-10-CM | POA: Diagnosis not present

## 2018-12-13 DIAGNOSIS — Z1339 Encounter for screening examination for other mental health and behavioral disorders: Secondary | ICD-10-CM | POA: Diagnosis not present

## 2018-12-13 DIAGNOSIS — J309 Allergic rhinitis, unspecified: Secondary | ICD-10-CM | POA: Diagnosis not present

## 2018-12-13 DIAGNOSIS — J449 Chronic obstructive pulmonary disease, unspecified: Secondary | ICD-10-CM | POA: Diagnosis not present

## 2018-12-13 DIAGNOSIS — I10 Essential (primary) hypertension: Secondary | ICD-10-CM | POA: Diagnosis not present

## 2019-02-28 DIAGNOSIS — R600 Localized edema: Secondary | ICD-10-CM | POA: Diagnosis not present

## 2019-02-28 DIAGNOSIS — E559 Vitamin D deficiency, unspecified: Secondary | ICD-10-CM | POA: Diagnosis not present

## 2019-02-28 DIAGNOSIS — J309 Allergic rhinitis, unspecified: Secondary | ICD-10-CM | POA: Diagnosis not present

## 2019-02-28 DIAGNOSIS — J449 Chronic obstructive pulmonary disease, unspecified: Secondary | ICD-10-CM | POA: Diagnosis not present

## 2019-02-28 DIAGNOSIS — I10 Essential (primary) hypertension: Secondary | ICD-10-CM | POA: Diagnosis not present

## 2019-02-28 DIAGNOSIS — G4700 Insomnia, unspecified: Secondary | ICD-10-CM | POA: Diagnosis not present

## 2019-02-28 DIAGNOSIS — J019 Acute sinusitis, unspecified: Secondary | ICD-10-CM | POA: Diagnosis not present

## 2019-02-28 DIAGNOSIS — F419 Anxiety disorder, unspecified: Secondary | ICD-10-CM | POA: Diagnosis not present

## 2019-02-28 DIAGNOSIS — E785 Hyperlipidemia, unspecified: Secondary | ICD-10-CM | POA: Diagnosis not present

## 2019-04-30 DIAGNOSIS — J440 Chronic obstructive pulmonary disease with acute lower respiratory infection: Secondary | ICD-10-CM | POA: Diagnosis not present

## 2019-04-30 DIAGNOSIS — J449 Chronic obstructive pulmonary disease, unspecified: Secondary | ICD-10-CM | POA: Diagnosis not present

## 2019-04-30 DIAGNOSIS — M7989 Other specified soft tissue disorders: Secondary | ICD-10-CM | POA: Diagnosis not present

## 2019-06-04 DIAGNOSIS — K219 Gastro-esophageal reflux disease without esophagitis: Secondary | ICD-10-CM | POA: Diagnosis not present

## 2019-06-04 DIAGNOSIS — F419 Anxiety disorder, unspecified: Secondary | ICD-10-CM | POA: Diagnosis not present

## 2019-06-04 DIAGNOSIS — I10 Essential (primary) hypertension: Secondary | ICD-10-CM | POA: Diagnosis not present

## 2019-06-04 DIAGNOSIS — J449 Chronic obstructive pulmonary disease, unspecified: Secondary | ICD-10-CM | POA: Diagnosis not present

## 2019-06-04 DIAGNOSIS — F329 Major depressive disorder, single episode, unspecified: Secondary | ICD-10-CM | POA: Diagnosis not present

## 2019-06-04 DIAGNOSIS — E785 Hyperlipidemia, unspecified: Secondary | ICD-10-CM | POA: Diagnosis not present

## 2019-06-04 DIAGNOSIS — G4700 Insomnia, unspecified: Secondary | ICD-10-CM | POA: Diagnosis not present

## 2019-06-04 DIAGNOSIS — M7989 Other specified soft tissue disorders: Secondary | ICD-10-CM | POA: Diagnosis not present

## 2019-06-04 DIAGNOSIS — E559 Vitamin D deficiency, unspecified: Secondary | ICD-10-CM | POA: Diagnosis not present

## 2019-06-12 DIAGNOSIS — E559 Vitamin D deficiency, unspecified: Secondary | ICD-10-CM | POA: Diagnosis not present

## 2019-06-12 DIAGNOSIS — E785 Hyperlipidemia, unspecified: Secondary | ICD-10-CM | POA: Diagnosis not present

## 2019-06-12 DIAGNOSIS — Z79899 Other long term (current) drug therapy: Secondary | ICD-10-CM | POA: Diagnosis not present

## 2019-09-03 DIAGNOSIS — F419 Anxiety disorder, unspecified: Secondary | ICD-10-CM | POA: Diagnosis not present

## 2019-09-03 DIAGNOSIS — F329 Major depressive disorder, single episode, unspecified: Secondary | ICD-10-CM | POA: Diagnosis not present

## 2019-09-03 DIAGNOSIS — Z6833 Body mass index (BMI) 33.0-33.9, adult: Secondary | ICD-10-CM | POA: Diagnosis not present

## 2019-09-03 DIAGNOSIS — E785 Hyperlipidemia, unspecified: Secondary | ICD-10-CM | POA: Diagnosis not present

## 2019-09-03 DIAGNOSIS — I10 Essential (primary) hypertension: Secondary | ICD-10-CM | POA: Diagnosis not present

## 2019-09-03 DIAGNOSIS — J309 Allergic rhinitis, unspecified: Secondary | ICD-10-CM | POA: Diagnosis not present

## 2019-09-03 DIAGNOSIS — J449 Chronic obstructive pulmonary disease, unspecified: Secondary | ICD-10-CM | POA: Diagnosis not present

## 2019-09-03 DIAGNOSIS — Z1339 Encounter for screening examination for other mental health and behavioral disorders: Secondary | ICD-10-CM | POA: Diagnosis not present

## 2019-09-03 DIAGNOSIS — G4700 Insomnia, unspecified: Secondary | ICD-10-CM | POA: Diagnosis not present

## 2019-09-11 DIAGNOSIS — Z9181 History of falling: Secondary | ICD-10-CM | POA: Diagnosis not present

## 2019-09-11 DIAGNOSIS — Z0000 Encounter for general adult medical examination without abnormal findings: Secondary | ICD-10-CM | POA: Diagnosis not present

## 2019-09-11 DIAGNOSIS — Z6833 Body mass index (BMI) 33.0-33.9, adult: Secondary | ICD-10-CM | POA: Diagnosis not present

## 2019-09-11 DIAGNOSIS — Z1331 Encounter for screening for depression: Secondary | ICD-10-CM | POA: Diagnosis not present

## 2019-09-11 DIAGNOSIS — E785 Hyperlipidemia, unspecified: Secondary | ICD-10-CM | POA: Diagnosis not present

## 2019-09-20 DIAGNOSIS — H25813 Combined forms of age-related cataract, bilateral: Secondary | ICD-10-CM | POA: Diagnosis not present

## 2019-10-01 DIAGNOSIS — J449 Chronic obstructive pulmonary disease, unspecified: Secondary | ICD-10-CM | POA: Diagnosis not present

## 2019-10-01 DIAGNOSIS — J019 Acute sinusitis, unspecified: Secondary | ICD-10-CM | POA: Diagnosis not present

## 2019-10-01 DIAGNOSIS — J309 Allergic rhinitis, unspecified: Secondary | ICD-10-CM | POA: Diagnosis not present

## 2019-10-02 DIAGNOSIS — J449 Chronic obstructive pulmonary disease, unspecified: Secondary | ICD-10-CM | POA: Diagnosis not present

## 2019-11-02 DIAGNOSIS — J440 Chronic obstructive pulmonary disease with acute lower respiratory infection: Secondary | ICD-10-CM | POA: Diagnosis not present

## 2019-11-02 DIAGNOSIS — F419 Anxiety disorder, unspecified: Secondary | ICD-10-CM | POA: Diagnosis not present

## 2019-11-02 DIAGNOSIS — J209 Acute bronchitis, unspecified: Secondary | ICD-10-CM | POA: Diagnosis not present

## 2019-11-02 DIAGNOSIS — F329 Major depressive disorder, single episode, unspecified: Secondary | ICD-10-CM | POA: Diagnosis not present

## 2019-12-05 DIAGNOSIS — J449 Chronic obstructive pulmonary disease, unspecified: Secondary | ICD-10-CM | POA: Diagnosis not present

## 2019-12-05 DIAGNOSIS — Z1339 Encounter for screening examination for other mental health and behavioral disorders: Secondary | ICD-10-CM | POA: Diagnosis not present

## 2019-12-05 DIAGNOSIS — I10 Essential (primary) hypertension: Secondary | ICD-10-CM | POA: Diagnosis not present

## 2019-12-05 DIAGNOSIS — E785 Hyperlipidemia, unspecified: Secondary | ICD-10-CM | POA: Diagnosis not present

## 2019-12-05 DIAGNOSIS — Z6833 Body mass index (BMI) 33.0-33.9, adult: Secondary | ICD-10-CM | POA: Diagnosis not present

## 2019-12-05 DIAGNOSIS — J309 Allergic rhinitis, unspecified: Secondary | ICD-10-CM | POA: Diagnosis not present

## 2019-12-05 DIAGNOSIS — E559 Vitamin D deficiency, unspecified: Secondary | ICD-10-CM | POA: Diagnosis not present

## 2019-12-05 DIAGNOSIS — F419 Anxiety disorder, unspecified: Secondary | ICD-10-CM | POA: Diagnosis not present

## 2019-12-05 DIAGNOSIS — K219 Gastro-esophageal reflux disease without esophagitis: Secondary | ICD-10-CM | POA: Diagnosis not present

## 2019-12-10 DIAGNOSIS — Z6833 Body mass index (BMI) 33.0-33.9, adult: Secondary | ICD-10-CM | POA: Diagnosis not present

## 2019-12-10 DIAGNOSIS — J3489 Other specified disorders of nose and nasal sinuses: Secondary | ICD-10-CM | POA: Diagnosis not present

## 2019-12-10 DIAGNOSIS — I10 Essential (primary) hypertension: Secondary | ICD-10-CM | POA: Diagnosis not present

## 2019-12-10 DIAGNOSIS — E669 Obesity, unspecified: Secondary | ICD-10-CM | POA: Diagnosis not present

## 2019-12-10 DIAGNOSIS — R05 Cough: Secondary | ICD-10-CM | POA: Diagnosis not present

## 2019-12-10 DIAGNOSIS — H8303 Labyrinthitis, bilateral: Secondary | ICD-10-CM | POA: Diagnosis not present

## 2019-12-10 DIAGNOSIS — Z20828 Contact with and (suspected) exposure to other viral communicable diseases: Secondary | ICD-10-CM | POA: Diagnosis not present

## 2019-12-25 DIAGNOSIS — Z79899 Other long term (current) drug therapy: Secondary | ICD-10-CM | POA: Diagnosis not present

## 2019-12-25 DIAGNOSIS — Z131 Encounter for screening for diabetes mellitus: Secondary | ICD-10-CM | POA: Diagnosis not present

## 2019-12-25 DIAGNOSIS — E785 Hyperlipidemia, unspecified: Secondary | ICD-10-CM | POA: Diagnosis not present

## 2019-12-25 DIAGNOSIS — E559 Vitamin D deficiency, unspecified: Secondary | ICD-10-CM | POA: Diagnosis not present

## 2020-02-22 DIAGNOSIS — F419 Anxiety disorder, unspecified: Secondary | ICD-10-CM | POA: Diagnosis not present

## 2020-02-22 DIAGNOSIS — E559 Vitamin D deficiency, unspecified: Secondary | ICD-10-CM | POA: Diagnosis not present

## 2020-02-22 DIAGNOSIS — Z6833 Body mass index (BMI) 33.0-33.9, adult: Secondary | ICD-10-CM | POA: Diagnosis not present

## 2020-02-22 DIAGNOSIS — M7989 Other specified soft tissue disorders: Secondary | ICD-10-CM | POA: Diagnosis not present

## 2020-02-22 DIAGNOSIS — I10 Essential (primary) hypertension: Secondary | ICD-10-CM | POA: Diagnosis not present

## 2020-02-22 DIAGNOSIS — E785 Hyperlipidemia, unspecified: Secondary | ICD-10-CM | POA: Diagnosis not present

## 2020-02-22 DIAGNOSIS — J309 Allergic rhinitis, unspecified: Secondary | ICD-10-CM | POA: Diagnosis not present

## 2020-02-22 DIAGNOSIS — J449 Chronic obstructive pulmonary disease, unspecified: Secondary | ICD-10-CM | POA: Diagnosis not present

## 2020-02-22 DIAGNOSIS — K219 Gastro-esophageal reflux disease without esophagitis: Secondary | ICD-10-CM | POA: Diagnosis not present

## 2020-05-21 DIAGNOSIS — M7989 Other specified soft tissue disorders: Secondary | ICD-10-CM | POA: Diagnosis not present

## 2020-05-21 DIAGNOSIS — F329 Major depressive disorder, single episode, unspecified: Secondary | ICD-10-CM | POA: Diagnosis not present

## 2020-05-21 DIAGNOSIS — K219 Gastro-esophageal reflux disease without esophagitis: Secondary | ICD-10-CM | POA: Diagnosis not present

## 2020-05-21 DIAGNOSIS — F419 Anxiety disorder, unspecified: Secondary | ICD-10-CM | POA: Diagnosis not present

## 2020-05-21 DIAGNOSIS — I10 Essential (primary) hypertension: Secondary | ICD-10-CM | POA: Diagnosis not present

## 2020-05-21 DIAGNOSIS — J449 Chronic obstructive pulmonary disease, unspecified: Secondary | ICD-10-CM | POA: Diagnosis not present

## 2020-05-21 DIAGNOSIS — E785 Hyperlipidemia, unspecified: Secondary | ICD-10-CM | POA: Diagnosis not present

## 2020-05-21 DIAGNOSIS — J309 Allergic rhinitis, unspecified: Secondary | ICD-10-CM | POA: Diagnosis not present

## 2020-05-21 DIAGNOSIS — E559 Vitamin D deficiency, unspecified: Secondary | ICD-10-CM | POA: Diagnosis not present

## 2020-07-09 DIAGNOSIS — J209 Acute bronchitis, unspecified: Secondary | ICD-10-CM | POA: Diagnosis not present

## 2020-07-09 DIAGNOSIS — J019 Acute sinusitis, unspecified: Secondary | ICD-10-CM | POA: Diagnosis not present

## 2020-07-09 DIAGNOSIS — J440 Chronic obstructive pulmonary disease with acute lower respiratory infection: Secondary | ICD-10-CM | POA: Diagnosis not present

## 2020-08-20 DIAGNOSIS — J449 Chronic obstructive pulmonary disease, unspecified: Secondary | ICD-10-CM | POA: Diagnosis not present

## 2020-08-20 DIAGNOSIS — I10 Essential (primary) hypertension: Secondary | ICD-10-CM | POA: Diagnosis not present

## 2020-08-20 DIAGNOSIS — E559 Vitamin D deficiency, unspecified: Secondary | ICD-10-CM | POA: Diagnosis not present

## 2020-08-20 DIAGNOSIS — E785 Hyperlipidemia, unspecified: Secondary | ICD-10-CM | POA: Diagnosis not present

## 2020-08-20 DIAGNOSIS — M7989 Other specified soft tissue disorders: Secondary | ICD-10-CM | POA: Diagnosis not present

## 2020-08-20 DIAGNOSIS — F419 Anxiety disorder, unspecified: Secondary | ICD-10-CM | POA: Diagnosis not present

## 2020-08-20 DIAGNOSIS — K219 Gastro-esophageal reflux disease without esophagitis: Secondary | ICD-10-CM | POA: Diagnosis not present

## 2020-08-20 DIAGNOSIS — J309 Allergic rhinitis, unspecified: Secondary | ICD-10-CM | POA: Diagnosis not present

## 2020-08-20 DIAGNOSIS — F329 Major depressive disorder, single episode, unspecified: Secondary | ICD-10-CM | POA: Diagnosis not present

## 2020-08-25 DIAGNOSIS — E559 Vitamin D deficiency, unspecified: Secondary | ICD-10-CM | POA: Diagnosis not present

## 2020-08-25 DIAGNOSIS — E785 Hyperlipidemia, unspecified: Secondary | ICD-10-CM | POA: Diagnosis not present

## 2020-08-25 DIAGNOSIS — Z79899 Other long term (current) drug therapy: Secondary | ICD-10-CM | POA: Diagnosis not present

## 2020-09-01 DIAGNOSIS — J209 Acute bronchitis, unspecified: Secondary | ICD-10-CM | POA: Diagnosis not present

## 2020-09-01 DIAGNOSIS — J019 Acute sinusitis, unspecified: Secondary | ICD-10-CM | POA: Diagnosis not present

## 2020-09-01 DIAGNOSIS — J440 Chronic obstructive pulmonary disease with acute lower respiratory infection: Secondary | ICD-10-CM | POA: Diagnosis not present

## 2020-09-12 DIAGNOSIS — Z139 Encounter for screening, unspecified: Secondary | ICD-10-CM | POA: Diagnosis not present

## 2020-09-12 DIAGNOSIS — Z1331 Encounter for screening for depression: Secondary | ICD-10-CM | POA: Diagnosis not present

## 2020-09-12 DIAGNOSIS — Z0000 Encounter for general adult medical examination without abnormal findings: Secondary | ICD-10-CM | POA: Diagnosis not present

## 2020-09-12 DIAGNOSIS — E785 Hyperlipidemia, unspecified: Secondary | ICD-10-CM | POA: Diagnosis not present

## 2020-09-12 DIAGNOSIS — Z9181 History of falling: Secondary | ICD-10-CM | POA: Diagnosis not present

## 2020-09-12 DIAGNOSIS — E669 Obesity, unspecified: Secondary | ICD-10-CM | POA: Diagnosis not present

## 2020-09-16 DIAGNOSIS — R7989 Other specified abnormal findings of blood chemistry: Secondary | ICD-10-CM | POA: Diagnosis not present

## 2020-09-16 DIAGNOSIS — R5383 Other fatigue: Secondary | ICD-10-CM | POA: Diagnosis not present

## 2020-09-16 DIAGNOSIS — Z79899 Other long term (current) drug therapy: Secondary | ICD-10-CM | POA: Diagnosis not present

## 2020-09-16 DIAGNOSIS — Z1321 Encounter for screening for nutritional disorder: Secondary | ICD-10-CM | POA: Diagnosis not present

## 2020-09-16 DIAGNOSIS — E559 Vitamin D deficiency, unspecified: Secondary | ICD-10-CM | POA: Diagnosis not present

## 2020-09-16 DIAGNOSIS — D649 Anemia, unspecified: Secondary | ICD-10-CM | POA: Diagnosis not present

## 2020-09-17 DIAGNOSIS — H52223 Regular astigmatism, bilateral: Secondary | ICD-10-CM | POA: Diagnosis not present

## 2020-09-17 DIAGNOSIS — H524 Presbyopia: Secondary | ICD-10-CM | POA: Diagnosis not present

## 2020-09-17 DIAGNOSIS — H25813 Combined forms of age-related cataract, bilateral: Secondary | ICD-10-CM | POA: Diagnosis not present

## 2020-09-17 DIAGNOSIS — H5203 Hypermetropia, bilateral: Secondary | ICD-10-CM | POA: Diagnosis not present

## 2020-10-16 DIAGNOSIS — D649 Anemia, unspecified: Secondary | ICD-10-CM | POA: Diagnosis not present

## 2020-11-05 DIAGNOSIS — I10 Essential (primary) hypertension: Secondary | ICD-10-CM | POA: Diagnosis not present

## 2020-11-05 DIAGNOSIS — J440 Chronic obstructive pulmonary disease with acute lower respiratory infection: Secondary | ICD-10-CM | POA: Diagnosis not present

## 2020-11-05 DIAGNOSIS — J019 Acute sinusitis, unspecified: Secondary | ICD-10-CM | POA: Diagnosis not present

## 2020-11-05 DIAGNOSIS — J209 Acute bronchitis, unspecified: Secondary | ICD-10-CM | POA: Diagnosis not present

## 2020-11-05 DIAGNOSIS — J449 Chronic obstructive pulmonary disease, unspecified: Secondary | ICD-10-CM | POA: Diagnosis not present

## 2020-11-11 DIAGNOSIS — J440 Chronic obstructive pulmonary disease with acute lower respiratory infection: Secondary | ICD-10-CM | POA: Diagnosis not present

## 2020-11-11 DIAGNOSIS — J209 Acute bronchitis, unspecified: Secondary | ICD-10-CM | POA: Diagnosis not present

## 2020-12-01 DIAGNOSIS — Z1159 Encounter for screening for other viral diseases: Secondary | ICD-10-CM | POA: Diagnosis not present

## 2020-12-01 DIAGNOSIS — Z1152 Encounter for screening for COVID-19: Secondary | ICD-10-CM | POA: Diagnosis not present

## 2020-12-03 DIAGNOSIS — F32A Depression, unspecified: Secondary | ICD-10-CM | POA: Diagnosis not present

## 2020-12-03 DIAGNOSIS — E785 Hyperlipidemia, unspecified: Secondary | ICD-10-CM | POA: Diagnosis not present

## 2020-12-03 DIAGNOSIS — M7989 Other specified soft tissue disorders: Secondary | ICD-10-CM | POA: Diagnosis not present

## 2020-12-03 DIAGNOSIS — M1990 Unspecified osteoarthritis, unspecified site: Secondary | ICD-10-CM | POA: Diagnosis not present

## 2020-12-03 DIAGNOSIS — J309 Allergic rhinitis, unspecified: Secondary | ICD-10-CM | POA: Diagnosis not present

## 2020-12-03 DIAGNOSIS — J449 Chronic obstructive pulmonary disease, unspecified: Secondary | ICD-10-CM | POA: Diagnosis not present

## 2020-12-03 DIAGNOSIS — F419 Anxiety disorder, unspecified: Secondary | ICD-10-CM | POA: Diagnosis not present

## 2020-12-03 DIAGNOSIS — G4700 Insomnia, unspecified: Secondary | ICD-10-CM | POA: Diagnosis not present

## 2020-12-03 DIAGNOSIS — K219 Gastro-esophageal reflux disease without esophagitis: Secondary | ICD-10-CM | POA: Diagnosis not present

## 2020-12-08 DIAGNOSIS — Z882 Allergy status to sulfonamides status: Secondary | ICD-10-CM | POA: Diagnosis not present

## 2020-12-08 DIAGNOSIS — D509 Iron deficiency anemia, unspecified: Secondary | ICD-10-CM | POA: Diagnosis not present

## 2020-12-08 DIAGNOSIS — K259 Gastric ulcer, unspecified as acute or chronic, without hemorrhage or perforation: Secondary | ICD-10-CM | POA: Diagnosis not present

## 2020-12-08 DIAGNOSIS — D649 Anemia, unspecified: Secondary | ICD-10-CM | POA: Diagnosis not present

## 2020-12-08 DIAGNOSIS — K449 Diaphragmatic hernia without obstruction or gangrene: Secondary | ICD-10-CM | POA: Diagnosis not present

## 2020-12-08 DIAGNOSIS — K2950 Unspecified chronic gastritis without bleeding: Secondary | ICD-10-CM | POA: Diagnosis not present

## 2020-12-11 DIAGNOSIS — K253 Acute gastric ulcer without hemorrhage or perforation: Secondary | ICD-10-CM | POA: Diagnosis not present

## 2020-12-11 DIAGNOSIS — K449 Diaphragmatic hernia without obstruction or gangrene: Secondary | ICD-10-CM | POA: Diagnosis not present

## 2020-12-12 DIAGNOSIS — J440 Chronic obstructive pulmonary disease with acute lower respiratory infection: Secondary | ICD-10-CM | POA: Diagnosis not present

## 2020-12-12 DIAGNOSIS — J209 Acute bronchitis, unspecified: Secondary | ICD-10-CM | POA: Diagnosis not present

## 2020-12-30 DIAGNOSIS — R040 Epistaxis: Secondary | ICD-10-CM | POA: Diagnosis not present

## 2020-12-30 DIAGNOSIS — J440 Chronic obstructive pulmonary disease with acute lower respiratory infection: Secondary | ICD-10-CM | POA: Diagnosis not present

## 2020-12-30 DIAGNOSIS — J209 Acute bronchitis, unspecified: Secondary | ICD-10-CM | POA: Diagnosis not present

## 2020-12-30 DIAGNOSIS — J019 Acute sinusitis, unspecified: Secondary | ICD-10-CM | POA: Diagnosis not present

## 2021-01-06 DIAGNOSIS — J019 Acute sinusitis, unspecified: Secondary | ICD-10-CM | POA: Diagnosis not present

## 2021-01-06 DIAGNOSIS — J440 Chronic obstructive pulmonary disease with acute lower respiratory infection: Secondary | ICD-10-CM | POA: Diagnosis not present

## 2021-01-06 DIAGNOSIS — J209 Acute bronchitis, unspecified: Secondary | ICD-10-CM | POA: Diagnosis not present

## 2021-01-12 DIAGNOSIS — R509 Fever, unspecified: Secondary | ICD-10-CM | POA: Diagnosis not present

## 2021-01-12 DIAGNOSIS — J9811 Atelectasis: Secondary | ICD-10-CM | POA: Diagnosis not present

## 2021-01-12 DIAGNOSIS — R531 Weakness: Secondary | ICD-10-CM | POA: Diagnosis not present

## 2021-01-12 DIAGNOSIS — J209 Acute bronchitis, unspecified: Secondary | ICD-10-CM | POA: Diagnosis not present

## 2021-01-12 DIAGNOSIS — U071 COVID-19: Secondary | ICD-10-CM | POA: Diagnosis not present

## 2021-01-12 DIAGNOSIS — R0902 Hypoxemia: Secondary | ICD-10-CM | POA: Diagnosis not present

## 2021-01-12 DIAGNOSIS — R0602 Shortness of breath: Secondary | ICD-10-CM | POA: Diagnosis not present

## 2021-01-12 DIAGNOSIS — M79641 Pain in right hand: Secondary | ICD-10-CM | POA: Diagnosis not present

## 2021-01-12 DIAGNOSIS — R197 Diarrhea, unspecified: Secondary | ICD-10-CM | POA: Diagnosis not present

## 2021-01-12 DIAGNOSIS — G4489 Other headache syndrome: Secondary | ICD-10-CM | POA: Diagnosis not present

## 2021-01-12 DIAGNOSIS — J440 Chronic obstructive pulmonary disease with acute lower respiratory infection: Secondary | ICD-10-CM | POA: Diagnosis not present

## 2021-02-02 DIAGNOSIS — E669 Obesity, unspecified: Secondary | ICD-10-CM | POA: Diagnosis not present

## 2021-02-02 DIAGNOSIS — F32A Depression, unspecified: Secondary | ICD-10-CM | POA: Diagnosis not present

## 2021-02-02 DIAGNOSIS — E559 Vitamin D deficiency, unspecified: Secondary | ICD-10-CM | POA: Diagnosis not present

## 2021-02-02 DIAGNOSIS — Z8616 Personal history of COVID-19: Secondary | ICD-10-CM | POA: Diagnosis not present

## 2021-02-02 DIAGNOSIS — J309 Allergic rhinitis, unspecified: Secondary | ICD-10-CM | POA: Diagnosis not present

## 2021-02-02 DIAGNOSIS — G4700 Insomnia, unspecified: Secondary | ICD-10-CM | POA: Diagnosis not present

## 2021-02-02 DIAGNOSIS — J449 Chronic obstructive pulmonary disease, unspecified: Secondary | ICD-10-CM | POA: Diagnosis not present

## 2021-02-02 DIAGNOSIS — Z6833 Body mass index (BMI) 33.0-33.9, adult: Secondary | ICD-10-CM | POA: Diagnosis not present

## 2021-02-02 DIAGNOSIS — F419 Anxiety disorder, unspecified: Secondary | ICD-10-CM | POA: Diagnosis not present

## 2021-02-09 DIAGNOSIS — J209 Acute bronchitis, unspecified: Secondary | ICD-10-CM | POA: Diagnosis not present

## 2021-02-09 DIAGNOSIS — J440 Chronic obstructive pulmonary disease with acute lower respiratory infection: Secondary | ICD-10-CM | POA: Diagnosis not present

## 2021-03-12 DIAGNOSIS — J209 Acute bronchitis, unspecified: Secondary | ICD-10-CM | POA: Diagnosis not present

## 2021-03-12 DIAGNOSIS — J440 Chronic obstructive pulmonary disease with acute lower respiratory infection: Secondary | ICD-10-CM | POA: Diagnosis not present

## 2021-04-11 DIAGNOSIS — J440 Chronic obstructive pulmonary disease with acute lower respiratory infection: Secondary | ICD-10-CM | POA: Diagnosis not present

## 2021-04-11 DIAGNOSIS — J209 Acute bronchitis, unspecified: Secondary | ICD-10-CM | POA: Diagnosis not present

## 2021-05-05 DIAGNOSIS — I10 Essential (primary) hypertension: Secondary | ICD-10-CM | POA: Diagnosis not present

## 2021-05-05 DIAGNOSIS — F32A Depression, unspecified: Secondary | ICD-10-CM | POA: Diagnosis not present

## 2021-05-05 DIAGNOSIS — E785 Hyperlipidemia, unspecified: Secondary | ICD-10-CM | POA: Diagnosis not present

## 2021-05-05 DIAGNOSIS — J449 Chronic obstructive pulmonary disease, unspecified: Secondary | ICD-10-CM | POA: Diagnosis not present

## 2021-05-05 DIAGNOSIS — J309 Allergic rhinitis, unspecified: Secondary | ICD-10-CM | POA: Diagnosis not present

## 2021-05-05 DIAGNOSIS — M7989 Other specified soft tissue disorders: Secondary | ICD-10-CM | POA: Diagnosis not present

## 2021-05-05 DIAGNOSIS — F419 Anxiety disorder, unspecified: Secondary | ICD-10-CM | POA: Diagnosis not present

## 2021-05-05 DIAGNOSIS — K219 Gastro-esophageal reflux disease without esophagitis: Secondary | ICD-10-CM | POA: Diagnosis not present

## 2021-05-05 DIAGNOSIS — E559 Vitamin D deficiency, unspecified: Secondary | ICD-10-CM | POA: Diagnosis not present

## 2021-05-12 DIAGNOSIS — J209 Acute bronchitis, unspecified: Secondary | ICD-10-CM | POA: Diagnosis not present

## 2021-05-12 DIAGNOSIS — J440 Chronic obstructive pulmonary disease with acute lower respiratory infection: Secondary | ICD-10-CM | POA: Diagnosis not present

## 2021-05-21 DIAGNOSIS — M7989 Other specified soft tissue disorders: Secondary | ICD-10-CM | POA: Diagnosis not present

## 2021-05-21 DIAGNOSIS — I10 Essential (primary) hypertension: Secondary | ICD-10-CM | POA: Diagnosis not present

## 2021-05-21 DIAGNOSIS — F419 Anxiety disorder, unspecified: Secondary | ICD-10-CM | POA: Diagnosis not present

## 2021-05-21 DIAGNOSIS — E785 Hyperlipidemia, unspecified: Secondary | ICD-10-CM | POA: Diagnosis not present

## 2021-05-21 DIAGNOSIS — E559 Vitamin D deficiency, unspecified: Secondary | ICD-10-CM | POA: Diagnosis not present

## 2021-05-21 DIAGNOSIS — J449 Chronic obstructive pulmonary disease, unspecified: Secondary | ICD-10-CM | POA: Diagnosis not present

## 2021-05-21 DIAGNOSIS — F32A Depression, unspecified: Secondary | ICD-10-CM | POA: Diagnosis not present

## 2021-05-21 DIAGNOSIS — K219 Gastro-esophageal reflux disease without esophagitis: Secondary | ICD-10-CM | POA: Diagnosis not present

## 2021-05-21 DIAGNOSIS — J309 Allergic rhinitis, unspecified: Secondary | ICD-10-CM | POA: Diagnosis not present

## 2021-05-22 DIAGNOSIS — R7303 Prediabetes: Secondary | ICD-10-CM | POA: Diagnosis not present

## 2021-05-22 DIAGNOSIS — Z79899 Other long term (current) drug therapy: Secondary | ICD-10-CM | POA: Diagnosis not present

## 2021-05-22 DIAGNOSIS — E559 Vitamin D deficiency, unspecified: Secondary | ICD-10-CM | POA: Diagnosis not present

## 2021-05-22 DIAGNOSIS — E785 Hyperlipidemia, unspecified: Secondary | ICD-10-CM | POA: Diagnosis not present

## 2021-06-11 DIAGNOSIS — J209 Acute bronchitis, unspecified: Secondary | ICD-10-CM | POA: Diagnosis not present

## 2021-06-11 DIAGNOSIS — J440 Chronic obstructive pulmonary disease with acute lower respiratory infection: Secondary | ICD-10-CM | POA: Diagnosis not present

## 2021-06-18 DIAGNOSIS — D649 Anemia, unspecified: Secondary | ICD-10-CM | POA: Diagnosis not present

## 2021-06-18 DIAGNOSIS — K253 Acute gastric ulcer without hemorrhage or perforation: Secondary | ICD-10-CM | POA: Diagnosis not present

## 2021-06-18 DIAGNOSIS — Z1212 Encounter for screening for malignant neoplasm of rectum: Secondary | ICD-10-CM | POA: Diagnosis not present

## 2021-06-26 DIAGNOSIS — D649 Anemia, unspecified: Secondary | ICD-10-CM | POA: Diagnosis not present

## 2021-07-06 DIAGNOSIS — D649 Anemia, unspecified: Secondary | ICD-10-CM | POA: Diagnosis not present

## 2021-07-12 DIAGNOSIS — J440 Chronic obstructive pulmonary disease with acute lower respiratory infection: Secondary | ICD-10-CM | POA: Diagnosis not present

## 2021-07-12 DIAGNOSIS — J209 Acute bronchitis, unspecified: Secondary | ICD-10-CM | POA: Diagnosis not present

## 2021-08-12 DIAGNOSIS — J440 Chronic obstructive pulmonary disease with acute lower respiratory infection: Secondary | ICD-10-CM | POA: Diagnosis not present

## 2021-08-12 DIAGNOSIS — J209 Acute bronchitis, unspecified: Secondary | ICD-10-CM | POA: Diagnosis not present

## 2021-08-26 DIAGNOSIS — J449 Chronic obstructive pulmonary disease, unspecified: Secondary | ICD-10-CM | POA: Diagnosis not present

## 2021-08-26 DIAGNOSIS — I10 Essential (primary) hypertension: Secondary | ICD-10-CM | POA: Diagnosis not present

## 2021-08-26 DIAGNOSIS — G4700 Insomnia, unspecified: Secondary | ICD-10-CM | POA: Diagnosis not present

## 2021-08-26 DIAGNOSIS — F32A Depression, unspecified: Secondary | ICD-10-CM | POA: Diagnosis not present

## 2021-08-26 DIAGNOSIS — F419 Anxiety disorder, unspecified: Secondary | ICD-10-CM | POA: Diagnosis not present

## 2021-08-26 DIAGNOSIS — K219 Gastro-esophageal reflux disease without esophagitis: Secondary | ICD-10-CM | POA: Diagnosis not present

## 2021-08-26 DIAGNOSIS — J309 Allergic rhinitis, unspecified: Secondary | ICD-10-CM | POA: Diagnosis not present

## 2021-08-26 DIAGNOSIS — E559 Vitamin D deficiency, unspecified: Secondary | ICD-10-CM | POA: Diagnosis not present

## 2021-08-26 DIAGNOSIS — E785 Hyperlipidemia, unspecified: Secondary | ICD-10-CM | POA: Diagnosis not present

## 2021-09-09 DIAGNOSIS — D649 Anemia, unspecified: Secondary | ICD-10-CM | POA: Diagnosis not present

## 2021-09-11 DIAGNOSIS — J440 Chronic obstructive pulmonary disease with acute lower respiratory infection: Secondary | ICD-10-CM | POA: Diagnosis not present

## 2021-09-11 DIAGNOSIS — J209 Acute bronchitis, unspecified: Secondary | ICD-10-CM | POA: Diagnosis not present

## 2021-09-17 DIAGNOSIS — D649 Anemia, unspecified: Secondary | ICD-10-CM | POA: Diagnosis not present

## 2021-09-17 DIAGNOSIS — Z8711 Personal history of peptic ulcer disease: Secondary | ICD-10-CM | POA: Diagnosis not present

## 2021-10-13 DIAGNOSIS — J209 Acute bronchitis, unspecified: Secondary | ICD-10-CM | POA: Diagnosis not present

## 2021-10-13 DIAGNOSIS — J309 Allergic rhinitis, unspecified: Secondary | ICD-10-CM | POA: Diagnosis not present

## 2021-10-13 DIAGNOSIS — J019 Acute sinusitis, unspecified: Secondary | ICD-10-CM | POA: Diagnosis not present

## 2021-10-13 DIAGNOSIS — E559 Vitamin D deficiency, unspecified: Secondary | ICD-10-CM | POA: Diagnosis not present

## 2021-10-13 DIAGNOSIS — J440 Chronic obstructive pulmonary disease with acute lower respiratory infection: Secondary | ICD-10-CM | POA: Diagnosis not present

## 2021-11-18 DIAGNOSIS — Z1331 Encounter for screening for depression: Secondary | ICD-10-CM | POA: Diagnosis not present

## 2021-11-18 DIAGNOSIS — E785 Hyperlipidemia, unspecified: Secondary | ICD-10-CM | POA: Diagnosis not present

## 2021-11-18 DIAGNOSIS — Z0000 Encounter for general adult medical examination without abnormal findings: Secondary | ICD-10-CM | POA: Diagnosis not present

## 2021-11-18 DIAGNOSIS — Z9181 History of falling: Secondary | ICD-10-CM | POA: Diagnosis not present

## 2021-11-18 DIAGNOSIS — Z139 Encounter for screening, unspecified: Secondary | ICD-10-CM | POA: Diagnosis not present

## 2021-12-14 DIAGNOSIS — E559 Vitamin D deficiency, unspecified: Secondary | ICD-10-CM | POA: Diagnosis not present

## 2021-12-14 DIAGNOSIS — E785 Hyperlipidemia, unspecified: Secondary | ICD-10-CM | POA: Diagnosis not present

## 2021-12-14 DIAGNOSIS — J449 Chronic obstructive pulmonary disease, unspecified: Secondary | ICD-10-CM | POA: Diagnosis not present

## 2021-12-14 DIAGNOSIS — K219 Gastro-esophageal reflux disease without esophagitis: Secondary | ICD-10-CM | POA: Diagnosis not present

## 2021-12-14 DIAGNOSIS — F32A Depression, unspecified: Secondary | ICD-10-CM | POA: Diagnosis not present

## 2021-12-14 DIAGNOSIS — F419 Anxiety disorder, unspecified: Secondary | ICD-10-CM | POA: Diagnosis not present

## 2021-12-14 DIAGNOSIS — J309 Allergic rhinitis, unspecified: Secondary | ICD-10-CM | POA: Diagnosis not present

## 2021-12-14 DIAGNOSIS — M7989 Other specified soft tissue disorders: Secondary | ICD-10-CM | POA: Diagnosis not present

## 2021-12-14 DIAGNOSIS — I10 Essential (primary) hypertension: Secondary | ICD-10-CM | POA: Diagnosis not present

## 2021-12-21 DIAGNOSIS — E559 Vitamin D deficiency, unspecified: Secondary | ICD-10-CM | POA: Diagnosis not present

## 2021-12-21 DIAGNOSIS — E785 Hyperlipidemia, unspecified: Secondary | ICD-10-CM | POA: Diagnosis not present

## 2021-12-21 DIAGNOSIS — Z79899 Other long term (current) drug therapy: Secondary | ICD-10-CM | POA: Diagnosis not present

## 2021-12-21 DIAGNOSIS — R7303 Prediabetes: Secondary | ICD-10-CM | POA: Diagnosis not present

## 2022-01-18 DIAGNOSIS — J019 Acute sinusitis, unspecified: Secondary | ICD-10-CM | POA: Diagnosis not present

## 2022-01-18 DIAGNOSIS — J209 Acute bronchitis, unspecified: Secondary | ICD-10-CM | POA: Diagnosis not present

## 2022-01-18 DIAGNOSIS — I10 Essential (primary) hypertension: Secondary | ICD-10-CM | POA: Diagnosis not present

## 2022-02-08 DIAGNOSIS — Z6833 Body mass index (BMI) 33.0-33.9, adult: Secondary | ICD-10-CM | POA: Diagnosis not present

## 2022-02-08 DIAGNOSIS — J019 Acute sinusitis, unspecified: Secondary | ICD-10-CM | POA: Diagnosis not present

## 2022-02-08 DIAGNOSIS — J209 Acute bronchitis, unspecified: Secondary | ICD-10-CM | POA: Diagnosis not present

## 2022-02-09 DIAGNOSIS — M1990 Unspecified osteoarthritis, unspecified site: Secondary | ICD-10-CM | POA: Diagnosis not present

## 2022-03-16 DIAGNOSIS — D649 Anemia, unspecified: Secondary | ICD-10-CM | POA: Diagnosis not present

## 2022-03-18 DIAGNOSIS — D649 Anemia, unspecified: Secondary | ICD-10-CM | POA: Diagnosis not present

## 2022-03-18 DIAGNOSIS — Z8711 Personal history of peptic ulcer disease: Secondary | ICD-10-CM | POA: Diagnosis not present

## 2022-03-24 DIAGNOSIS — M7989 Other specified soft tissue disorders: Secondary | ICD-10-CM | POA: Diagnosis not present

## 2022-03-24 DIAGNOSIS — K219 Gastro-esophageal reflux disease without esophagitis: Secondary | ICD-10-CM | POA: Diagnosis not present

## 2022-03-24 DIAGNOSIS — J449 Chronic obstructive pulmonary disease, unspecified: Secondary | ICD-10-CM | POA: Diagnosis not present

## 2022-03-24 DIAGNOSIS — E559 Vitamin D deficiency, unspecified: Secondary | ICD-10-CM | POA: Diagnosis not present

## 2022-03-24 DIAGNOSIS — F419 Anxiety disorder, unspecified: Secondary | ICD-10-CM | POA: Diagnosis not present

## 2022-03-24 DIAGNOSIS — E785 Hyperlipidemia, unspecified: Secondary | ICD-10-CM | POA: Diagnosis not present

## 2022-03-24 DIAGNOSIS — R7303 Prediabetes: Secondary | ICD-10-CM | POA: Diagnosis not present

## 2022-03-24 DIAGNOSIS — J309 Allergic rhinitis, unspecified: Secondary | ICD-10-CM | POA: Diagnosis not present

## 2022-03-24 DIAGNOSIS — I10 Essential (primary) hypertension: Secondary | ICD-10-CM | POA: Diagnosis not present

## 2022-04-14 DIAGNOSIS — M79661 Pain in right lower leg: Secondary | ICD-10-CM | POA: Diagnosis not present

## 2022-04-14 DIAGNOSIS — M7989 Other specified soft tissue disorders: Secondary | ICD-10-CM | POA: Diagnosis not present

## 2022-04-14 DIAGNOSIS — Z6828 Body mass index (BMI) 28.0-28.9, adult: Secondary | ICD-10-CM | POA: Diagnosis not present

## 2022-04-15 DIAGNOSIS — I872 Venous insufficiency (chronic) (peripheral): Secondary | ICD-10-CM | POA: Diagnosis not present

## 2022-04-16 DIAGNOSIS — I82411 Acute embolism and thrombosis of right femoral vein: Secondary | ICD-10-CM | POA: Diagnosis not present

## 2022-04-16 DIAGNOSIS — R0602 Shortness of breath: Secondary | ICD-10-CM | POA: Diagnosis not present

## 2022-04-16 DIAGNOSIS — I82491 Acute embolism and thrombosis of other specified deep vein of right lower extremity: Secondary | ICD-10-CM | POA: Diagnosis not present

## 2022-04-16 DIAGNOSIS — I82441 Acute embolism and thrombosis of right tibial vein: Secondary | ICD-10-CM | POA: Diagnosis not present

## 2022-04-19 DIAGNOSIS — Z8719 Personal history of other diseases of the digestive system: Secondary | ICD-10-CM | POA: Diagnosis not present

## 2022-04-19 DIAGNOSIS — Z09 Encounter for follow-up examination after completed treatment for conditions other than malignant neoplasm: Secondary | ICD-10-CM | POA: Diagnosis not present

## 2022-04-19 DIAGNOSIS — I82401 Acute embolism and thrombosis of unspecified deep veins of right lower extremity: Secondary | ICD-10-CM | POA: Diagnosis not present

## 2022-04-19 DIAGNOSIS — Z6828 Body mass index (BMI) 28.0-28.9, adult: Secondary | ICD-10-CM | POA: Diagnosis not present

## 2022-04-24 DIAGNOSIS — Z7902 Long term (current) use of antithrombotics/antiplatelets: Secondary | ICD-10-CM | POA: Diagnosis not present

## 2022-04-24 DIAGNOSIS — Z79899 Other long term (current) drug therapy: Secondary | ICD-10-CM | POA: Diagnosis not present

## 2022-04-24 DIAGNOSIS — R531 Weakness: Secondary | ICD-10-CM | POA: Diagnosis not present

## 2022-04-24 DIAGNOSIS — S81811A Laceration without foreign body, right lower leg, initial encounter: Secondary | ICD-10-CM | POA: Diagnosis not present

## 2022-04-24 DIAGNOSIS — Z79891 Long term (current) use of opiate analgesic: Secondary | ICD-10-CM | POA: Diagnosis not present

## 2022-04-24 DIAGNOSIS — Z7982 Long term (current) use of aspirin: Secondary | ICD-10-CM | POA: Diagnosis not present

## 2022-04-28 DIAGNOSIS — M79604 Pain in right leg: Secondary | ICD-10-CM | POA: Diagnosis not present

## 2022-04-28 DIAGNOSIS — I82401 Acute embolism and thrombosis of unspecified deep veins of right lower extremity: Secondary | ICD-10-CM | POA: Diagnosis not present

## 2022-04-28 DIAGNOSIS — Z7409 Other reduced mobility: Secondary | ICD-10-CM | POA: Diagnosis not present

## 2022-04-28 DIAGNOSIS — R531 Weakness: Secondary | ICD-10-CM | POA: Diagnosis not present

## 2022-04-28 DIAGNOSIS — S81801A Unspecified open wound, right lower leg, initial encounter: Secondary | ICD-10-CM | POA: Diagnosis not present

## 2022-04-28 DIAGNOSIS — Z6828 Body mass index (BMI) 28.0-28.9, adult: Secondary | ICD-10-CM | POA: Diagnosis not present

## 2022-05-19 DIAGNOSIS — S81801A Unspecified open wound, right lower leg, initial encounter: Secondary | ICD-10-CM | POA: Diagnosis not present

## 2022-05-19 DIAGNOSIS — Z6828 Body mass index (BMI) 28.0-28.9, adult: Secondary | ICD-10-CM | POA: Diagnosis not present

## 2022-05-19 DIAGNOSIS — I82401 Acute embolism and thrombosis of unspecified deep veins of right lower extremity: Secondary | ICD-10-CM | POA: Diagnosis not present

## 2022-05-19 DIAGNOSIS — M79604 Pain in right leg: Secondary | ICD-10-CM | POA: Diagnosis not present

## 2022-05-19 DIAGNOSIS — I10 Essential (primary) hypertension: Secondary | ICD-10-CM | POA: Diagnosis not present

## 2022-05-28 DIAGNOSIS — M79604 Pain in right leg: Secondary | ICD-10-CM | POA: Diagnosis not present

## 2022-06-28 DIAGNOSIS — M79604 Pain in right leg: Secondary | ICD-10-CM | POA: Diagnosis not present

## 2022-06-28 DIAGNOSIS — I83009 Varicose veins of unspecified lower extremity with ulcer of unspecified site: Secondary | ICD-10-CM | POA: Diagnosis not present

## 2022-06-28 DIAGNOSIS — Z7409 Other reduced mobility: Secondary | ICD-10-CM | POA: Diagnosis not present

## 2022-06-28 DIAGNOSIS — L97909 Non-pressure chronic ulcer of unspecified part of unspecified lower leg with unspecified severity: Secondary | ICD-10-CM | POA: Diagnosis not present

## 2022-06-28 DIAGNOSIS — J209 Acute bronchitis, unspecified: Secondary | ICD-10-CM | POA: Diagnosis not present

## 2022-06-28 DIAGNOSIS — J440 Chronic obstructive pulmonary disease with acute lower respiratory infection: Secondary | ICD-10-CM | POA: Diagnosis not present

## 2022-07-05 DIAGNOSIS — I872 Venous insufficiency (chronic) (peripheral): Secondary | ICD-10-CM | POA: Diagnosis not present

## 2022-07-05 DIAGNOSIS — R531 Weakness: Secondary | ICD-10-CM | POA: Diagnosis not present

## 2022-07-05 DIAGNOSIS — M7989 Other specified soft tissue disorders: Secondary | ICD-10-CM | POA: Diagnosis not present

## 2022-07-05 DIAGNOSIS — J449 Chronic obstructive pulmonary disease, unspecified: Secondary | ICD-10-CM | POA: Diagnosis not present

## 2022-07-05 DIAGNOSIS — Z6828 Body mass index (BMI) 28.0-28.9, adult: Secondary | ICD-10-CM | POA: Diagnosis not present

## 2022-07-05 DIAGNOSIS — M1990 Unspecified osteoarthritis, unspecified site: Secondary | ICD-10-CM | POA: Diagnosis not present

## 2022-07-05 DIAGNOSIS — M79604 Pain in right leg: Secondary | ICD-10-CM | POA: Diagnosis not present

## 2022-07-05 DIAGNOSIS — Z7409 Other reduced mobility: Secondary | ICD-10-CM | POA: Diagnosis not present

## 2022-07-05 DIAGNOSIS — R6889 Other general symptoms and signs: Secondary | ICD-10-CM | POA: Diagnosis not present

## 2022-07-09 DIAGNOSIS — L97909 Non-pressure chronic ulcer of unspecified part of unspecified lower leg with unspecified severity: Secondary | ICD-10-CM | POA: Diagnosis not present

## 2022-07-09 DIAGNOSIS — L89309 Pressure ulcer of unspecified buttock, unspecified stage: Secondary | ICD-10-CM | POA: Diagnosis not present

## 2022-07-09 DIAGNOSIS — I83009 Varicose veins of unspecified lower extremity with ulcer of unspecified site: Secondary | ICD-10-CM | POA: Diagnosis not present

## 2022-07-14 ENCOUNTER — Inpatient Hospital Stay (HOSPITAL_COMMUNITY): Payer: Medicare PPO

## 2022-07-14 ENCOUNTER — Inpatient Hospital Stay (HOSPITAL_COMMUNITY)
Admission: EM | Admit: 2022-07-14 | Discharge: 2022-07-20 | DRG: 481 | Disposition: A | Discharge status: Skilled Nursing Facility | Payer: Medicare PPO | Attending: Family Medicine | Admitting: Family Medicine

## 2022-07-14 ENCOUNTER — Emergency Department (HOSPITAL_COMMUNITY): Payer: Medicare PPO

## 2022-07-14 ENCOUNTER — Encounter (HOSPITAL_COMMUNITY): Payer: Self-pay | Admitting: Internal Medicine

## 2022-07-14 ENCOUNTER — Other Ambulatory Visit: Payer: Self-pay

## 2022-07-14 DIAGNOSIS — M80052A Age-related osteoporosis with current pathological fracture, left femur, initial encounter for fracture: Principal | ICD-10-CM | POA: Diagnosis present

## 2022-07-14 DIAGNOSIS — E1142 Type 2 diabetes mellitus with diabetic polyneuropathy: Secondary | ICD-10-CM | POA: Diagnosis present

## 2022-07-14 DIAGNOSIS — R079 Chest pain, unspecified: Secondary | ICD-10-CM | POA: Diagnosis not present

## 2022-07-14 DIAGNOSIS — E0590 Thyrotoxicosis, unspecified without thyrotoxic crisis or storm: Secondary | ICD-10-CM | POA: Diagnosis present

## 2022-07-14 DIAGNOSIS — Z7984 Long term (current) use of oral hypoglycemic drugs: Secondary | ICD-10-CM

## 2022-07-14 DIAGNOSIS — S0191XA Laceration without foreign body of unspecified part of head, initial encounter: Secondary | ICD-10-CM | POA: Diagnosis present

## 2022-07-14 DIAGNOSIS — S7292XA Unspecified fracture of left femur, initial encounter for closed fracture: Secondary | ICD-10-CM

## 2022-07-14 DIAGNOSIS — E785 Hyperlipidemia, unspecified: Secondary | ICD-10-CM | POA: Diagnosis present

## 2022-07-14 DIAGNOSIS — Z888 Allergy status to other drugs, medicaments and biological substances status: Secondary | ICD-10-CM

## 2022-07-14 DIAGNOSIS — I10 Essential (primary) hypertension: Secondary | ICD-10-CM | POA: Diagnosis not present

## 2022-07-14 DIAGNOSIS — D72829 Elevated white blood cell count, unspecified: Secondary | ICD-10-CM | POA: Diagnosis present

## 2022-07-14 DIAGNOSIS — L89152 Pressure ulcer of sacral region, stage 2: Secondary | ICD-10-CM | POA: Diagnosis present

## 2022-07-14 DIAGNOSIS — M21371 Foot drop, right foot: Secondary | ICD-10-CM | POA: Diagnosis present

## 2022-07-14 DIAGNOSIS — Z8249 Family history of ischemic heart disease and other diseases of the circulatory system: Secondary | ICD-10-CM | POA: Diagnosis not present

## 2022-07-14 DIAGNOSIS — Z86718 Personal history of other venous thrombosis and embolism: Secondary | ICD-10-CM | POA: Diagnosis not present

## 2022-07-14 DIAGNOSIS — W19XXXA Unspecified fall, initial encounter: Secondary | ICD-10-CM | POA: Diagnosis not present

## 2022-07-14 DIAGNOSIS — F17290 Nicotine dependence, other tobacco product, uncomplicated: Secondary | ICD-10-CM | POA: Diagnosis present

## 2022-07-14 DIAGNOSIS — S81819A Laceration without foreign body, unspecified lower leg, initial encounter: Secondary | ICD-10-CM | POA: Diagnosis not present

## 2022-07-14 DIAGNOSIS — S0083XA Contusion of other part of head, initial encounter: Secondary | ICD-10-CM | POA: Diagnosis not present

## 2022-07-14 DIAGNOSIS — Z7951 Long term (current) use of inhaled steroids: Secondary | ICD-10-CM

## 2022-07-14 DIAGNOSIS — J449 Chronic obstructive pulmonary disease, unspecified: Secondary | ICD-10-CM | POA: Diagnosis not present

## 2022-07-14 DIAGNOSIS — M79605 Pain in left leg: Secondary | ICD-10-CM | POA: Diagnosis not present

## 2022-07-14 DIAGNOSIS — S81012A Laceration without foreign body, left knee, initial encounter: Secondary | ICD-10-CM | POA: Diagnosis not present

## 2022-07-14 DIAGNOSIS — K449 Diaphragmatic hernia without obstruction or gangrene: Secondary | ICD-10-CM | POA: Diagnosis not present

## 2022-07-14 DIAGNOSIS — D62 Acute posthemorrhagic anemia: Secondary | ICD-10-CM | POA: Diagnosis not present

## 2022-07-14 DIAGNOSIS — I110 Hypertensive heart disease with heart failure: Secondary | ICD-10-CM | POA: Diagnosis not present

## 2022-07-14 DIAGNOSIS — S81812A Laceration without foreign body, left lower leg, initial encounter: Secondary | ICD-10-CM | POA: Diagnosis present

## 2022-07-14 DIAGNOSIS — Z882 Allergy status to sulfonamides status: Secondary | ICD-10-CM

## 2022-07-14 DIAGNOSIS — Z96642 Presence of left artificial hip joint: Secondary | ICD-10-CM | POA: Diagnosis not present

## 2022-07-14 DIAGNOSIS — Z96652 Presence of left artificial knee joint: Secondary | ICD-10-CM | POA: Diagnosis not present

## 2022-07-14 DIAGNOSIS — I509 Heart failure, unspecified: Secondary | ICD-10-CM | POA: Diagnosis not present

## 2022-07-14 DIAGNOSIS — S72452A Displaced supracondylar fracture without intracondylar extension of lower end of left femur, initial encounter for closed fracture: Secondary | ICD-10-CM | POA: Diagnosis not present

## 2022-07-14 DIAGNOSIS — Z66 Do not resuscitate: Secondary | ICD-10-CM | POA: Diagnosis present

## 2022-07-14 DIAGNOSIS — Z79899 Other long term (current) drug therapy: Secondary | ICD-10-CM

## 2022-07-14 DIAGNOSIS — S199XXA Unspecified injury of neck, initial encounter: Secondary | ICD-10-CM | POA: Diagnosis not present

## 2022-07-14 DIAGNOSIS — E119 Type 2 diabetes mellitus without complications: Secondary | ICD-10-CM

## 2022-07-14 DIAGNOSIS — E079 Disorder of thyroid, unspecified: Secondary | ICD-10-CM | POA: Diagnosis present

## 2022-07-14 DIAGNOSIS — S7290XA Unspecified fracture of unspecified femur, initial encounter for closed fracture: Secondary | ICD-10-CM | POA: Diagnosis present

## 2022-07-14 DIAGNOSIS — Z880 Allergy status to penicillin: Secondary | ICD-10-CM

## 2022-07-14 DIAGNOSIS — M898X9 Other specified disorders of bone, unspecified site: Secondary | ICD-10-CM | POA: Diagnosis present

## 2022-07-14 DIAGNOSIS — M9712XA Periprosthetic fracture around internal prosthetic left knee joint, initial encounter: Secondary | ICD-10-CM | POA: Diagnosis not present

## 2022-07-14 DIAGNOSIS — S83249A Other tear of medial meniscus, current injury, unspecified knee, initial encounter: Secondary | ICD-10-CM | POA: Diagnosis not present

## 2022-07-14 DIAGNOSIS — R110 Nausea: Secondary | ICD-10-CM | POA: Diagnosis not present

## 2022-07-14 DIAGNOSIS — L8990 Pressure ulcer of unspecified site, unspecified stage: Secondary | ICD-10-CM | POA: Insufficient documentation

## 2022-07-14 DIAGNOSIS — G629 Polyneuropathy, unspecified: Secondary | ICD-10-CM | POA: Diagnosis not present

## 2022-07-14 DIAGNOSIS — I5032 Chronic diastolic (congestive) heart failure: Secondary | ICD-10-CM | POA: Diagnosis not present

## 2022-07-14 DIAGNOSIS — W050XXA Fall from non-moving wheelchair, initial encounter: Secondary | ICD-10-CM | POA: Diagnosis present

## 2022-07-14 DIAGNOSIS — J9811 Atelectasis: Secondary | ICD-10-CM | POA: Diagnosis not present

## 2022-07-14 DIAGNOSIS — R9431 Abnormal electrocardiogram [ECG] [EKG]: Secondary | ICD-10-CM | POA: Diagnosis not present

## 2022-07-14 DIAGNOSIS — J439 Emphysema, unspecified: Secondary | ICD-10-CM | POA: Diagnosis not present

## 2022-07-14 DIAGNOSIS — S3993XA Unspecified injury of pelvis, initial encounter: Secondary | ICD-10-CM | POA: Diagnosis not present

## 2022-07-14 DIAGNOSIS — S72402A Unspecified fracture of lower end of left femur, initial encounter for closed fracture: Secondary | ICD-10-CM | POA: Diagnosis not present

## 2022-07-14 DIAGNOSIS — Z96651 Presence of right artificial knee joint: Secondary | ICD-10-CM | POA: Diagnosis present

## 2022-07-14 DIAGNOSIS — S7292XD Unspecified fracture of left femur, subsequent encounter for closed fracture with routine healing: Secondary | ICD-10-CM | POA: Diagnosis not present

## 2022-07-14 DIAGNOSIS — M419 Scoliosis, unspecified: Secondary | ICD-10-CM | POA: Diagnosis not present

## 2022-07-14 DIAGNOSIS — E876 Hypokalemia: Secondary | ICD-10-CM | POA: Diagnosis not present

## 2022-07-14 DIAGNOSIS — R9082 White matter disease, unspecified: Secondary | ICD-10-CM | POA: Diagnosis not present

## 2022-07-14 DIAGNOSIS — I959 Hypotension, unspecified: Secondary | ICD-10-CM | POA: Diagnosis not present

## 2022-07-14 DIAGNOSIS — Z7902 Long term (current) use of antithrombotics/antiplatelets: Secondary | ICD-10-CM

## 2022-07-14 DIAGNOSIS — Z7901 Long term (current) use of anticoagulants: Secondary | ICD-10-CM

## 2022-07-14 DIAGNOSIS — M21372 Foot drop, left foot: Secondary | ICD-10-CM | POA: Diagnosis present

## 2022-07-14 DIAGNOSIS — F17200 Nicotine dependence, unspecified, uncomplicated: Secondary | ICD-10-CM | POA: Diagnosis not present

## 2022-07-14 DIAGNOSIS — M5030 Other cervical disc degeneration, unspecified cervical region: Secondary | ICD-10-CM | POA: Diagnosis not present

## 2022-07-14 DIAGNOSIS — Z043 Encounter for examination and observation following other accident: Secondary | ICD-10-CM | POA: Diagnosis not present

## 2022-07-14 DIAGNOSIS — Z7401 Bed confinement status: Secondary | ICD-10-CM | POA: Diagnosis not present

## 2022-07-14 DIAGNOSIS — S0993XA Unspecified injury of face, initial encounter: Secondary | ICD-10-CM | POA: Diagnosis not present

## 2022-07-14 DIAGNOSIS — R102 Pelvic and perineal pain: Secondary | ICD-10-CM | POA: Diagnosis not present

## 2022-07-14 LAB — CBC WITH DIFFERENTIAL/PLATELET
Abs Immature Granulocytes: 0.18 10*3/uL — ABNORMAL HIGH (ref 0.00–0.07)
Basophils Absolute: 0.1 10*3/uL (ref 0.0–0.1)
Basophils Relative: 0 %
Eosinophils Absolute: 0 10*3/uL (ref 0.0–0.5)
Eosinophils Relative: 0 %
HCT: 36.5 % (ref 36.0–46.0)
Hemoglobin: 11.9 g/dL — ABNORMAL LOW (ref 12.0–15.0)
Immature Granulocytes: 1 %
Lymphocytes Relative: 3 %
Lymphs Abs: 0.6 10*3/uL — ABNORMAL LOW (ref 0.7–4.0)
MCH: 27.7 pg (ref 26.0–34.0)
MCHC: 32.6 g/dL (ref 30.0–36.0)
MCV: 84.9 fL (ref 80.0–100.0)
Monocytes Absolute: 1.2 10*3/uL — ABNORMAL HIGH (ref 0.1–1.0)
Monocytes Relative: 6 %
Neutro Abs: 17.3 10*3/uL — ABNORMAL HIGH (ref 1.7–7.7)
Neutrophils Relative %: 90 %
Platelets: 332 10*3/uL (ref 150–400)
RBC: 4.3 MIL/uL (ref 3.87–5.11)
RDW: 15.6 % — ABNORMAL HIGH (ref 11.5–15.5)
WBC: 19.4 10*3/uL — ABNORMAL HIGH (ref 4.0–10.5)
nRBC: 0 % (ref 0.0–0.2)

## 2022-07-14 LAB — URINALYSIS, ROUTINE W REFLEX MICROSCOPIC
Bilirubin Urine: NEGATIVE
Glucose, UA: NEGATIVE mg/dL
Hgb urine dipstick: NEGATIVE
Ketones, ur: NEGATIVE mg/dL
Leukocytes,Ua: NEGATIVE
Nitrite: NEGATIVE
Protein, ur: NEGATIVE mg/dL
Specific Gravity, Urine: 1.006 (ref 1.005–1.030)
pH: 6 (ref 5.0–8.0)

## 2022-07-14 LAB — BASIC METABOLIC PANEL
Anion gap: 14 (ref 5–15)
BUN: 31 mg/dL — ABNORMAL HIGH (ref 8–23)
CO2: 30 mmol/L (ref 22–32)
Calcium: 9.3 mg/dL (ref 8.9–10.3)
Chloride: 94 mmol/L — ABNORMAL LOW (ref 98–111)
Creatinine, Ser: 0.93 mg/dL (ref 0.44–1.00)
GFR, Estimated: >60 mL/min (ref >60)
Glucose, Bld: 139 mg/dL — ABNORMAL HIGH (ref 70–99)
Potassium: 2.7 mmol/L — CL (ref 3.5–5.1)
Sodium: 138 mmol/L (ref 135–145)

## 2022-07-14 LAB — LACTIC ACID, PLASMA: Lactic Acid, Venous: 1.9 mmol/L (ref 0.5–1.9)

## 2022-07-14 LAB — CBG MONITORING, ED: Glucose-Capillary: 130 mg/dL — ABNORMAL HIGH (ref 70–99)

## 2022-07-14 LAB — MAGNESIUM: Magnesium: 1.7 mg/dL (ref 1.7–2.4)

## 2022-07-14 MED ORDER — OXYCODONE HCL 5 MG PO TABS
5.0000 mg | ORAL_TABLET | ORAL | Status: DC | PRN
  Administered 2022-07-14: 5 mg via ORAL
  Administered 2022-07-16 – 2022-07-18 (×5): 10 mg via ORAL
  Administered 2022-07-19: 5 mg via ORAL
  Administered 2022-07-19: 10 mg via ORAL
  Filled 2022-07-14: qty 1
  Filled 2022-07-14 (×2): qty 2
  Filled 2022-07-14: qty 1
  Filled 2022-07-14 (×4): qty 2

## 2022-07-14 MED ORDER — BISACODYL 5 MG PO TBEC: 5.0000 mg | DELAYED_RELEASE_TABLET | Freq: Every day | ORAL | Status: DC | PRN

## 2022-07-14 MED ORDER — POTASSIUM CHLORIDE 10 MEQ/100ML IV SOLN
10.0000 meq | INTRAVENOUS | Status: AC
  Administered 2022-07-14 (×3): 10 meq via INTRAVENOUS
  Filled 2022-07-14 (×3): qty 100

## 2022-07-14 MED ORDER — ACETAMINOPHEN 325 MG PO TABS
650.0000 mg | ORAL_TABLET | Freq: Once | ORAL | Status: AC
  Administered 2022-07-14: 650 mg via ORAL
  Filled 2022-07-14: qty 2

## 2022-07-14 MED ORDER — PANTOPRAZOLE SODIUM 40 MG PO TBEC
40.0000 mg | DELAYED_RELEASE_TABLET | Freq: Every day | ORAL | Status: DC
  Administered 2022-07-16 – 2022-07-20 (×5): 40 mg via ORAL
  Filled 2022-07-14 (×5): qty 1

## 2022-07-14 MED ORDER — TRAZODONE HCL 50 MG PO TABS
25.0000 mg | ORAL_TABLET | Freq: Every evening | ORAL | Status: DC | PRN
  Administered 2022-07-14: 25 mg via ORAL
  Administered 2022-07-17 – 2022-07-18 (×2): 50 mg via ORAL
  Administered 2022-07-18: 25 mg via ORAL
  Administered 2022-07-19: 50 mg via ORAL
  Filled 2022-07-14 (×5): qty 1

## 2022-07-14 MED ORDER — CILOSTAZOL 50 MG PO TABS
50.0000 mg | ORAL_TABLET | Freq: Two times a day (BID) | ORAL | Status: DC
  Administered 2022-07-14 – 2022-07-20 (×11): 50 mg via ORAL
  Filled 2022-07-14 (×13): qty 1

## 2022-07-14 MED ORDER — INSULIN ASPART 100 UNIT/ML IJ SOLN: 0.0000 [IU] | Freq: Three times a day (TID) | INTRAMUSCULAR | Status: DC

## 2022-07-14 MED ORDER — METHOCARBAMOL 1000 MG/10ML IJ SOLN: 500.0000 mg | Freq: Four times a day (QID) | INTRAVENOUS | Status: DC | PRN

## 2022-07-14 MED ORDER — MORPHINE SULFATE (PF) 2 MG/ML IV SOLN
2.0000 mg | INTRAVENOUS | Status: DC | PRN
  Administered 2022-07-14 – 2022-07-15 (×3): 2 mg via INTRAVENOUS
  Filled 2022-07-14 (×3): qty 1

## 2022-07-14 MED ORDER — FENTANYL CITRATE PF 50 MCG/ML IJ SOSY
25.0000 ug | PREFILLED_SYRINGE | Freq: Once | INTRAMUSCULAR | Status: AC
  Administered 2022-07-14: 25 ug via INTRAVENOUS
  Filled 2022-07-14: qty 1

## 2022-07-14 MED ORDER — FLUTICASONE FUROATE-VILANTEROL 100-25 MCG/ACT IN AEPB
1.0000 | INHALATION_SPRAY | Freq: Every day | RESPIRATORY_TRACT | Status: DC
Start: 2022-07-15 — End: 2022-07-20
  Administered 2022-07-16 – 2022-07-20 (×5): 1 via RESPIRATORY_TRACT
  Filled 2022-07-14: qty 28

## 2022-07-14 MED ORDER — DOCUSATE SODIUM 100 MG PO CAPS
100.0000 mg | ORAL_CAPSULE | Freq: Two times a day (BID) | ORAL | Status: DC
  Administered 2022-07-14: 100 mg via ORAL
  Filled 2022-07-14: qty 1

## 2022-07-14 MED ORDER — METHOCARBAMOL 500 MG PO TABS
500.0000 mg | ORAL_TABLET | Freq: Four times a day (QID) | ORAL | Status: DC | PRN
  Administered 2022-07-16 – 2022-07-18 (×4): 500 mg via ORAL
  Filled 2022-07-14 (×4): qty 1

## 2022-07-14 MED ORDER — ONDANSETRON HCL 4 MG/2ML IJ SOLN
4.0000 mg | Freq: Once | INTRAMUSCULAR | Status: AC
  Administered 2022-07-14: 4 mg via INTRAVENOUS

## 2022-07-14 MED ORDER — POLYETHYLENE GLYCOL 3350 17 G PO PACK: 17.0000 g | PACK | Freq: Every day | ORAL | Status: DC | PRN

## 2022-07-14 MED ORDER — POTASSIUM CHLORIDE CRYS ER 20 MEQ PO TBCR
40.0000 meq | EXTENDED_RELEASE_TABLET | Freq: Once | ORAL | Status: AC
  Administered 2022-07-14: 40 meq via ORAL
  Filled 2022-07-14: qty 2

## 2022-07-14 MED ORDER — VALSARTAN-HYDROCHLOROTHIAZIDE 320-25 MG PO TABS: 1.0000 | ORAL_TABLET | Freq: Every day | ORAL | Status: DC

## 2022-07-14 MED ORDER — INSULIN ASPART 100 UNIT/ML IJ SOLN: 0.0000 [IU] | Freq: Every day | INTRAMUSCULAR | Status: DC

## 2022-07-14 MED ORDER — MONTELUKAST SODIUM 10 MG PO TABS
10.0000 mg | ORAL_TABLET | Freq: Every day | ORAL | Status: DC
  Administered 2022-07-16 – 2022-07-20 (×5): 10 mg via ORAL
  Filled 2022-07-14 (×5): qty 1

## 2022-07-14 MED ORDER — IPRATROPIUM-ALBUTEROL 0.5-2.5 (3) MG/3ML IN SOLN
3.0000 mL | Freq: Four times a day (QID) | RESPIRATORY_TRACT | Status: DC
Start: 2022-07-14 — End: 2022-07-15
  Administered 2022-07-14: 3 mL via RESPIRATORY_TRACT
  Filled 2022-07-14 (×2): qty 3

## 2022-07-14 MED ORDER — ATORVASTATIN CALCIUM 10 MG PO TABS
20.0000 mg | ORAL_TABLET | Freq: Every day | ORAL | Status: DC
  Administered 2022-07-16 – 2022-07-20 (×5): 20 mg via ORAL
  Filled 2022-07-14 (×5): qty 2

## 2022-07-14 MED ORDER — SERTRALINE HCL 50 MG PO TABS
50.0000 mg | ORAL_TABLET | Freq: Every day | ORAL | Status: DC
  Administered 2022-07-16 – 2022-07-20 (×5): 50 mg via ORAL
  Filled 2022-07-14 (×5): qty 1

## 2022-07-14 NOTE — H&P (Signed)
History and Physical    Patient: Darlene Hernandez WGN:[id number] DOB: [birth year] DOA: 07/14/2022 DOS: the patient was seen and examined on 07/14/2022 PCP: Lucianne LeiUppin, Nina, MD  Patient coming from: Home - lives with daughter and SIL; NOK: Daughter, Lawerance BachDonna Hollady, [phone]   Chief Complaint: Fall  HPI: Darlene Hernandez is a 81 y.o. female with medical history significant of chronic diastolic CHF; DM; HTN; and hypothyroidism presenting with a fall.   She reports that she has severe neuropathy and is non-ambulatory at baseline but is able to transfer from bed to wheelchair and to propel herself with her legs in the wheelchair.  She was transferring today and didn't have the locks fully engaged on the wheelchair and it slid, causing her to fall.  She injured her left face and left leg.  Her left shoulder is also sore.      ER Course:  Mechanical fall.  Periprosthestic femur fracture.  Hypokalemia.       Review of Systems: As mentioned in the history of present illness. All other systems reviewed and are negative. Past Medical History:  Diagnosis Date   Congestive heart failure (CHF) (HCC)    Diabetes mellitus without complication (HCC)    Hip fracture (HCC)    Hypertension    Thyroid disease    Vision abnormalities    Past Surgical History:  Procedure Laterality Date   ABDOMINAL HYSTERECTOMY     HERNIA REPAIR     REPLACEMENT TOTAL KNEE Right    REPLACEMENT TOTAL KNEE BILATERAL     Social History:  reports that she has been smoking e-cigarettes. She has never used smokeless tobacco. She reports that she does not drink alcohol and does not use drugs.  Allergies  Allergen Reactions   Penicillins Anaphylaxis   Sulfa Antibiotics Anaphylaxis   Prednisone Other (See Comments)    "drives me up the wall and out of my mind"    Family History  Problem Relation Age of Onset   Heart attack Mother    ALS Father     Prior to Admission medications   Medication Sig Start Date End Date Taking?  Authorizing Provider  atorvastatin (LIPITOR) 20 MG tablet Take by mouth. 04/15/16   [provider]  cetirizine (ZYRTEC) 10 MG tablet Take by mouth.    [provider]  Cholecalciferol 1000 units CHEW Chew by mouth.    [provider]  cilostazol (PLETAL) 50 MG tablet Take by mouth. 12/06/16   [provider]  clonazePAM (KLONOPIN) 0.5 MG tablet TAKE HALF TAB BY MOUTH TWICE DAILY USE AS NEEDED AND MAY TAKE HALF TAB AS NEEDED 11/17/16   [provider]  clonazePAM (KLONOPIN) 0.5 MG tablet  11/17/16   [provider]  Dextromethorphan-Guaifenesin 5-100 MG/5ML LIQD Taking one tab daily for cough/congestion    [provider]  diclofenac (VOLTAREN) 75 MG EC tablet TAKE 1 TABLET BY MOUTH TWICE DAILY WITH FOOD 11/15/16   [provider]  estradiol (ESTRACE VAGINAL) 0.1 MG/GM vaginal cream Place vaginally. 07/03/15   [provider]  fluticasone Aleda Grana(FLONASE) 50 MCG/ACT nasal spray Use 2 sprays in each nostril once daily 07/08/15   [provider]  fluticasone furoate-vilanterol (BREO ELLIPTA) 100-25 MCG/INH AEPB Inhale into the lungs. 11/15/16   [provider]  furosemide (LASIX) 40 MG tablet  01/01/17   [provider]  gabapentin (NEURONTIN) 600 MG tablet Take by mouth. 09/16/15   [provider]  HYDROcodone-acetaminophen (NORCO) 10-325 MG tablet  09/28/16  [provider]  metFORMIN (GLUCOPHAGE-XR) 500 MG 24 hr tablet Take by mouth. 11/15/16 11/15/17  [provider]  methimazole (TAPAZOLE) 5 MG tablet TAKE 1/2 TABLET BY MOUTH DAILY 11/15/16   [provider]  oxyCODONE (OXY IR/ROXICODONE) 5 MG immediate release tablet  11/09/16   [provider]  potassium chloride SA (K-DUR,KLOR-CON) 20 MEQ tablet  01/01/17   [provider]  ranitidine (ZANTAC) 150 MG capsule Take by mouth.    [provider]  sertraline (ZOLOFT) 25 MG tablet TAKE 1 TABLET  BY MOUTH ONCE DAILY 10/28/16   [provider]  valsartan-hydrochlorothiazide (DIOVAN-HCT) 320-25 MG tablet Take by mouth. 11/15/16   [provider]    Physical Exam: Vitals:   07/14/22 1422 07/14/22 1430 07/14/22 1600 07/14/22 1615  BP:  107/60 108/67 118/62  Pulse:  90 89 77  Resp:  18 (!) 23 16  Temp:      TempSrc:      SpO2: 91% 91% (!) 88% 96%  Weight:      Height:       General:  Appears calm and comfortable and is in NAD, battered left lateral face/infraorbital region Eyes:  PERRL, EOMI, normal lids, iris ENT:  grossly normal hearing, lips & tongue, mmm; some absent dentition Neck:  no LAD, masses or thyromegaly Cardiovascular:  RRR, no m/r/g. No LE edema.  Respiratory:   CTA bilaterally with no wheezes/rales/rhonchi.  Normal respiratory effort. Abdomen:  soft, NT, ND Skin:  L infraorbital hematoma with L temporal region abrasion and ecchymosis; B LE stasis dermatitis Musculoskeletal:  poor strength BUE < BLE, L upper leg with bruising but no obvious deformity Psychiatric:  blunted mood and affect, speech fluent and appropriate, AOx3 Neurologic:  CN 2-12 grossly intact, moves all extremities in coordinated fashion other than L > R LE   Radiological Exams on Admission: Independently reviewed - see discussion in A/P where applicable  DG Shoulder Left  Result Date: 07/14/2022 CLINICAL DATA:  Patient arrives from home, head mechanical fall. 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 There are subchondral cystic changes and osteophytes about the humeral head. There is osteolysis and fragmentation of the distal clavicle. IMPRESSION: 1. Advanced degenerative changes of the left shoulder with osseous remodeling of the glenoid and osteolysis and fragmentation of the distal clavicle. 2.  No definite  evidence of acute fracture. Electronically Signed   By: Keane Police D.O.   On: 07/14/2022 16:47   CT Head Wo Contrast  Result Date: 07/14/2022 CLINICAL DATA:  Fall, trauma EXAM: CT HEAD WITHOUT CONTRAST CT MAXILLOFACIAL WITHOUT CONTRAST CT CERVICAL SPINE WITHOUT CONTRAST TECHNIQUE: Multidetector CT imaging of the head, cervical spine, and maxillofacial structures were performed using the standard protocol without intravenous contrast. Multiplanar CT image reconstructions of the cervical spine and maxillofacial structures were also generated. RADIATION DOSE REDUCTION: This exam was performed according to the departmental dose-optimization program which includes automated exposure control, adjustment of the mA and/or kV according to patient size and/or use of iterative reconstruction technique. COMPARISON:  None Available. FINDINGS: CT HEAD FINDINGS Brain: No evidence of acute infarction, hemorrhage, hydrocephalus, extra-axial collection or mass lesion/mass effect. Extensive periventricular and deep white matter hypodensity. Vascular: No hyperdense vessel or unexpected calcification. CT FACIAL BONES FINDINGS Skull: Normal. Negative for fracture or focal lesion. Facial bones: No displaced fractures or dislocations. Sinuses/Orbits: No  acute finding. Other: Soft tissue contusion of the left cheek and chin. CT CERVICAL SPINE FINDINGS Alignment: Normal. Skull base and vertebrae: No acute fracture. No primary bone lesion or focal pathologic process. Soft tissues and spinal canal: No prevertebral fluid or swelling. No visible canal hematoma. Disc levels: Moderate multilevel cervical disc degenerative disease and osteophytosis, worst from C3 through C6. Upper chest: Emphysema. Other: None. IMPRESSION: 1. No acute intracranial pathology. Small-vessel white matter disease. 2. No displaced fractures or dislocations of the facial bones. Soft tissue contusion of the left cheek and chin. 3. No fracture or subluxation of the  cervical spine. Moderate multilevel cervical disc degenerative disease. 4. Emphysema. Emphysema (ICD10-J43.9). Electronically Signed   By: Delanna Ahmadi M.D.   On: 07/14/2022 15:01   CT Maxillofacial Wo Contrast  Result Date: 07/14/2022 CLINICAL DATA:  Fall, trauma EXAM: CT HEAD WITHOUT CONTRAST CT MAXILLOFACIAL WITHOUT CONTRAST CT CERVICAL SPINE WITHOUT CONTRAST TECHNIQUE: Multidetector CT imaging of the head, cervical spine, and maxillofacial structures were performed using the standard protocol without intravenous contrast. Multiplanar CT image reconstructions of the cervical spine and maxillofacial structures were also generated. RADIATION DOSE REDUCTION: This exam was performed according to the departmental dose-optimization program which includes automated exposure control, adjustment of the mA and/or kV according to patient size and/or use of iterative reconstruction technique. COMPARISON:  None Available. FINDINGS: CT HEAD FINDINGS Brain: No evidence of acute infarction, hemorrhage, hydrocephalus, extra-axial collection or mass lesion/mass effect. Extensive periventricular and deep white matter hypodensity. Vascular: No hyperdense vessel or unexpected calcification. CT FACIAL BONES FINDINGS Skull: Normal. Negative for fracture or focal lesion. Facial bones: No displaced fractures or dislocations. Sinuses/Orbits: No acute finding. Other: Soft tissue contusion of the left cheek and chin. CT CERVICAL SPINE FINDINGS Alignment: Normal. Skull base and vertebrae: No acute fracture. No primary bone lesion or focal pathologic process. Soft tissues and spinal canal: No prevertebral fluid or swelling. No visible canal hematoma. Disc levels: Moderate multilevel cervical disc degenerative disease and osteophytosis, worst from C3 through C6. Upper chest: Emphysema. Other: None. IMPRESSION: 1. No acute intracranial pathology. Small-vessel white matter disease. 2. No displaced fractures or dislocations of the facial  bones. Soft tissue contusion of the left cheek and chin. 3. No fracture or subluxation of the cervical spine. Moderate multilevel cervical disc degenerative disease. 4. Emphysema. Emphysema (ICD10-J43.9). Electronically Signed   By: Delanna Ahmadi M.D.   On: 07/14/2022 15:01   CT Cervical Spine Wo Contrast  Result Date: 07/14/2022 CLINICAL DATA:  Fall, trauma EXAM: CT HEAD WITHOUT CONTRAST CT MAXILLOFACIAL WITHOUT CONTRAST CT CERVICAL SPINE WITHOUT CONTRAST TECHNIQUE: Multidetector CT imaging of the head, cervical spine, and maxillofacial structures were performed using the standard protocol without intravenous contrast. Multiplanar CT image reconstructions of the cervical spine and maxillofacial structures were also generated. RADIATION DOSE REDUCTION: This exam was performed according to the departmental dose-optimization program which includes automated exposure control, adjustment of the mA and/or kV according to patient size and/or use of iterative reconstruction technique. COMPARISON:  None Available. FINDINGS: CT HEAD FINDINGS Brain: No evidence of acute infarction, hemorrhage, hydrocephalus, extra-axial collection or mass lesion/mass effect. Extensive periventricular and deep white matter hypodensity. Vascular: No hyperdense vessel or unexpected calcification. CT FACIAL BONES FINDINGS Skull: Normal. Negative for fracture or focal lesion. Facial bones: No displaced fractures or dislocations. Sinuses/Orbits: No acute finding. Other: Soft tissue contusion of the left cheek and chin. CT CERVICAL SPINE FINDINGS Alignment: Normal. Skull base and vertebrae: No acute fracture. No primary bone lesion  or focal pathologic process. Soft tissues and spinal canal: No prevertebral fluid or swelling. No visible canal hematoma. Disc levels: Moderate multilevel cervical disc degenerative disease and osteophytosis, worst from C3 through C6. Upper chest: Emphysema. Other: None. IMPRESSION: 1. No acute intracranial pathology.  Small-vessel white matter disease. 2. No displaced fractures or dislocations of the facial bones. Soft tissue contusion of the left cheek and chin. 3. No fracture or subluxation of the cervical spine. Moderate multilevel cervical disc degenerative disease. 4. Emphysema. Emphysema (ICD10-J43.9). Electronically Signed   By: Jearld Lesch M.D.   On: 07/14/2022 15:01   DG Chest 1 View  Result Date: 07/14/2022 CLINICAL DATA:  Clinical fall at home, LEFT lower extremity pain EXAM: CHEST  1 VIEW COMPARISON:  01/12/2021 FINDINGS: Normal heart size and pulmonary vascularity. Atherosclerotic calcification aorta. Large hiatal hernia. LEFT basilar atelectasis. Chronic accentuation of pulmonary markings without acute infiltrate, pleural effusion, or pneumothorax. BILATERAL glenohumeral degenerative changes. IMPRESSION: Large hiatal hernia with LEFT basilar atelectasis. Aortic Atherosclerosis (ICD10-I70.0). Electronically Signed   By: Ulyses Southward M.D.   On: 07/14/2022 14:56   DG Knee Complete 4 Views Left  Result Date: 07/14/2022 CLINICAL DATA:  Mechanical fall, LEFT lower extremity pain EXAM: LEFT KNEE - COMPLETE 4+ VIEW COMPARISON:  None FINDINGS: Osseous demineralization. Components of LEFT knee prosthesis. Mildly displaced fracture distal LEFT femoral metadiaphysis extending to prosthesis. No dislocation. Visualized tibia and fibula appear intact. Question minimal joint effusion. Scattered atherosclerotic calcifications. IMPRESSION: Mildly displaced distal LEFT femoral metadiaphyseal fracture extending to knee prosthesis. Question minimal joint effusion. Electronically Signed   By: Ulyses Southward M.D.   On: 07/14/2022 14:55   DG Pelvis 1-2 Views  Result Date: 07/14/2022 CLINICAL DATA:  Mid ankle fall, LEFT lower extremity pain EXAM: PELVIS - 1-2 VIEW COMPARISON:  None FINDINGS: Osseous demineralization. Hip and SI joint spaces preserved. Extensive deformities of the RIGHT hemipelvis from old superior and inferior  pubic rami fractures. Advanced degenerative disc and facet disease changes lumbar spine with dextroconvex scoliosis. No acute fracture, dislocation, or bone destruction. Question prior hernia repair upper RIGHT pelvis. IMPRESSION:.: IMPRESSION:. Degenerative disc and facet disease changes lumbar spine with dextroconvex scoliosis. Osseous demineralization with old posttraumatic deformities of LEFT hemipelvis. No acute osseous abnormalities Electronically Signed   By: Ulyses Southward M.D.   On: 07/14/2022 14:50    EKG: Independently reviewed.  NSR with rate 87; nonspecific ST changes with no evidence of acute ischemia   Labs on Admission: I have personally reviewed the available labs and imaging studies at the time of the admission.  Pertinent labs:    K+ 2.7 Glucose 139 WBC 19.4    Assessment and Plan: Principal Problem:   Femur fracture (HCC) Active Problems:   Polyneuropathy   Chronic diastolic CHF (congestive heart failure) (HCC)   Diabetes mellitus without complication (HCC)   Hypertension   Thyroid disease   DNR (do not resuscitate)    L distal femur fracture -Apparently mechanical fall resulting in periprosthetic femur fracture -Orthopedics consulted -NPO after midnight in anticipation of surgical repair tomorrow -SCDs overnight, start Lovenox post-operatively (or as per ortho) -Pain control with Tylenol, Robaxin, Oxycodone, and Morphine prn -TOC team consult for rehab placement - although given her limited mobility at baseline, home with PT may also be appropriate -Will need PT consult post-operatively -Hip fracture order set utilized -TXA per orthopedics -Fascia iliacus block ordered per anesthesia  Pre-operative stratification -Orthopedic/spinal surgery is associated with an intermediate (1-5%) cardiovascular risk for cardiac death and  nonfatal MI -Her revised cardiac index gives a risk estimate of 6% -Because of this risk, she is recommended to have pre-operative EKG  testing prior to surgery; this was done in the ER -It is reasonable for her to go to the OR without additional evaluation  ?Infection -Family reports that she was started on doxy by her PCP earlier this week for "infection"  -When further queried, there were not specific reported symptoms that appear to have led to this and the patient's daughter encouraged [place] to call her PCP for clarification -Will hold doxy for now -Her daughter does report sacral pressure ulcers (POA) so will order wound care consult  Polyneuropathy -Essentially non-ambulatory at baseline -This may factor in to placement decisions  Chronic diastolic CHF -No echo available -Appears compensated currently -Hold Lasix  DM -Reported h/o but no recent A1c -Mild hyperglycemia on presentation today - ?acute phase -Will check A1c -Will cover for now with moderate-scale SSI  HTN -Continue Diovan-HCT -Has related hypokalemia which was repleted in the ER; may need ongoing repletion  Hyperthyroidism -Previously on methimazole -Appears to be not taking it currently -Needs outpatient f/u  HLD -Continue Lipitor  PAD -Continue Pletal  COPD -Reports 2 cigs/day for many years but no heavy smoking history -COPD on imaging -Continue Duoneb, Singulair, and Breo  ?Afib -Patient with Xarelto listed on med rec -Pharmacy reports that it was last filled in May but daughter reports that patient is taking it -No current evidence of afib, no obvious reason for it in Care Everywhere (last records were from 2018) -Will request PCP records  DNR -I have discussed code status with the patient and her daughter and  they are in agreement that the patient would not desire resuscitation and would prefer to die a natural death should that situation arise. -She will need a gold out of facility DNR form at the time of discharge     Advance Care Planning:   Code Status: DNR   Consults: Orthopedics; Nutrition; TOC team; will need PT  post-operatively  DVT Prophylaxis: SCDs  Family Communication: Daughter was present throughout evaluation  Severity of Illness: The appropriate patient status for this patient is INPATIENT. Inpatient status is judged to be reasonable and necessary in order to provide the required intensity of service to ensure the patient's safety. The patient's presenting symptoms, physical exam findings, and initial radiographic and laboratory data in the context of their chronic comorbidities is felt to place them at high risk for further clinical deterioration. Furthermore, it is not anticipated that the patient will be medically stable for discharge from the hospital within 2 midnights of admission.   * I certify that at the point of admission it is my clinical judgment that the patient will require inpatient hospital care spanning beyond 2 midnights from the point of admission due to high intensity of service, high risk for further deterioration and high frequency of surveillance required.*  Author: Jonah Blue, MD 07/14/2022 6:53 PM  For on call review [email or web address].

## 2022-07-14 NOTE — ED Notes (Signed)
[name] (Daughter) [phone]

## 2022-07-14 NOTE — ED Notes (Signed)
Transport requested at this time.

## 2022-07-14 NOTE — ED Notes (Signed)
Pts O2 dropped to 86% on RA after pain medication. 2L [place] applied for comfort. Pts O2 up to 97%.

## 2022-07-14 NOTE — ED Provider Notes (Signed)
[facility] EMERGENCY DEPARTMENT Provider Note   CSN: [id number] Arrival date & time: 07/14/22  1350     History  Chief Complaint  Patient presents with   Fall   Head Laceration    Darlene Hernandez is a 81 y.o. female with a past medical history of polyneuropathy and bilateral foot drop presenting today after fall.  She reports that earlier she was getting into her wheelchair and she forgot to lock 1 side of it.  She subsequently fell onto the floor on her left side.  No history of seizure or syncope.  Endorsing a purely mechanical fall.  Is not on blood thinners but did hit her head.  Denies loss of consciousness.  Was unable to get up on her own and called 911.   Fall  Head Laceration       Home Medications Prior to Admission medications   Medication Sig Start Date End Date Taking? Authorizing Provider  atorvastatin (LIPITOR) 20 MG tablet Take by mouth. 04/15/16   [provider]  cetirizine (ZYRTEC) 10 MG tablet Take by mouth.    [provider]  Cholecalciferol 1000 units CHEW Chew by mouth.    [provider]  cilostazol (PLETAL) 50 MG tablet Take by mouth. 12/06/16   [provider]  clonazePAM (KLONOPIN) 0.5 MG tablet TAKE HALF TAB BY MOUTH TWICE DAILY USE AS NEEDED AND MAY TAKE HALF TAB AS NEEDED 11/17/16   [provider]  clonazePAM (KLONOPIN) 0.5 MG tablet  11/17/16   [provider]  Dextromethorphan-Guaifenesin 5-100 MG/5ML LIQD Taking one tab daily for cough/congestion    [provider]  diclofenac (VOLTAREN) 75 MG EC tablet TAKE 1 TABLET BY MOUTH TWICE DAILY WITH FOOD 11/15/16   [provider]  estradiol (ESTRACE VAGINAL) 0.1 MG/GM vaginal cream Place vaginally. 07/03/15   [provider]  fluticasone Aleda Grana) 50 MCG/ACT nasal spray Use 2 sprays in each nostril once daily 07/08/15   [provider]  fluticasone furoate-vilanterol (BREO ELLIPTA) 100-25 MCG/INH AEPB  Inhale into the lungs. 11/15/16   [provider]  furosemide (LASIX) 40 MG tablet  01/01/17   [provider]  gabapentin (NEURONTIN) 600 MG tablet Take by mouth. 09/16/15   [provider]  HYDROcodone-acetaminophen [facility]) 10-325 MG tablet  09/28/16   [provider]  metFORMIN (GLUCOPHAGE-XR) 500 MG 24 hr tablet Take by mouth. 11/15/16 11/15/17  [provider]  methimazole (TAPAZOLE) 5 MG tablet TAKE 1/2 TABLET BY MOUTH DAILY 11/15/16   [provider]  oxyCODONE (OXY IR/ROXICODONE) 5 MG immediate release tablet  11/09/16   [provider]  potassium chloride SA (K-DUR,KLOR-CON) 20 MEQ tablet  01/01/17   [provider]  ranitidine (ZANTAC) 150 MG capsule Take by mouth.    [provider]  sertraline (ZOLOFT) 25 MG tablet TAKE 1 TABLET BY MOUTH ONCE DAILY 10/28/16   [provider]  valsartan-hydrochlorothiazide (DIOVAN-HCT) 320-25 MG tablet Take by mouth. 11/15/16   [provider]      Allergies    Penicillins, Sulfa antibiotics, and Prednisone    Review of Systems   Review of Systems  Physical Exam Updated Vital Signs BP 113/64   Pulse 93   Temp 98 F (36.7 C) (Oral)   Resp 19   Ht 5\' 6"  (1.676 m)   Wt 61.2 kg   SpO2 91%   BMI 21.79 kg/m  Physical Exam Vitals and nursing note reviewed.  Constitutional:  Appearance: Normal appearance.  HENT:     Head: Normocephalic.     Comments: Patient with golf ball sized hematoma to the left cheekbone.  Also with superficial abrasion to the left brow and dried blood in her hair    Mouth/Throat:     Mouth: Mucous membranes are moist.     Pharynx: Oropharynx is clear.     Comments: No signs of trauma in the oropharynx Eyes:     General: No scleral icterus.    Conjunctiva/sclera: Conjunctivae normal.  Cardiovascular:     Rate and Rhythm: Normal rate and regular rhythm.     Heart sounds: No murmur heard. Pulmonary:     Effort:  Pulmonary effort is normal. No respiratory distress.     Breath sounds: No wheezing.  Abdominal:     General: Abdomen is flat.     Palpations: Abdomen is soft.     Tenderness: There is no abdominal tenderness.     Comments: No bruising  Musculoskeletal:        General: Tenderness present. No deformity or signs of injury.     Cervical back: Normal range of motion. No tenderness.     Comments: Limited range of motion and tenderness to palpation of the left hip.  Passive range of motion intact.  Some soft tissue swelling to the left distal distal femur.   Skin:    General: Skin is warm and dry.     Findings: Bruising present. No rash.     Comments: Multiple areas of bruising to patient's bilateral upper and lower extremities.  Also with some bruising to her face and neck.  Neurological:     Mental Status: She is alert and oriented to person, place, and time.     Motor: No weakness.  Psychiatric:        Mood and Affect: Mood normal.        Behavior: Behavior normal.     ED Results / Procedures / Treatments   Labs (all labs ordered are listed, but only abnormal results are displayed) Labs Reviewed  CBC WITH DIFFERENTIAL/PLATELET - Abnormal; Notable for the following components:      Result Value   WBC 19.4 (*)    Hemoglobin 11.9 (*)    RDW 15.6 (*)    Neutro Abs 17.3 (*)    Lymphs Abs 0.6 (*)    Monocytes Absolute 1.2 (*)    Abs Immature Granulocytes 0.18 (*)    All other components within normal limits  BASIC METABOLIC PANEL - Abnormal; Notable for the following components:   Potassium 2.7 (*)    Chloride 94 (*)    Glucose, Bld 139 (*)    BUN 31 (*)    All other components within normal limits  URINALYSIS, ROUTINE W REFLEX MICROSCOPIC  LACTIC ACID, PLASMA  LACTIC ACID, PLASMA  MAGNESIUM    EKG EKG Interpretation  Date/Time:  Wednesday July 14 2022 15:38:25 EDT Ventricular Rate:  87 PR Interval:  54 QRS Duration: 89 QT Interval:  346 QTC Calculation: 417 R  Axis:   74 Text Interpretation: Sinus rhythm Atrial premature complexes Short PR interval Abnormal T, consider ischemia, diffuse leads No previous tracing Confirmed by Gwyneth Sprout ([id number]) on 07/14/2022 3:42:28 PM  Radiology CT Head Wo Contrast  Result Date: 07/14/2022 CLINICAL DATA:  Fall, trauma EXAM: CT HEAD WITHOUT CONTRAST CT MAXILLOFACIAL WITHOUT CONTRAST CT CERVICAL SPINE WITHOUT CONTRAST TECHNIQUE: Multidetector CT imaging of the head, cervical spine, and maxillofacial structures were performed using the  standard protocol without intravenous contrast. Multiplanar CT image reconstructions of the cervical spine and maxillofacial structures were also generated. RADIATION DOSE REDUCTION: This exam was performed according to the departmental dose-optimization program which includes automated exposure control, adjustment of the mA and/or kV according to patient size and/or use of iterative reconstruction technique. COMPARISON:  None Available. FINDINGS: CT HEAD FINDINGS Brain: No evidence of acute infarction, hemorrhage, hydrocephalus, extra-axial collection or mass lesion/mass effect. Extensive periventricular and deep white matter hypodensity. Vascular: No hyperdense vessel or unexpected calcification. CT FACIAL BONES FINDINGS Skull: Normal. Negative for fracture or focal lesion. Facial bones: No displaced fractures or dislocations. Sinuses/Orbits: No acute finding. Other: Soft tissue contusion of the left cheek and chin. CT CERVICAL SPINE FINDINGS Alignment: Normal. Skull base and vertebrae: No acute fracture. No primary bone lesion or focal pathologic process. Soft tissues and spinal canal: No prevertebral fluid or swelling. No visible canal hematoma. Disc levels: Moderate multilevel cervical disc degenerative disease and osteophytosis, worst from C3 through C6. Upper chest: Emphysema. Other: None. IMPRESSION: 1. No acute intracranial pathology. Small-vessel white matter disease. 2. No displaced  fractures or dislocations of the facial bones. Soft tissue contusion of the left cheek and chin. 3. No fracture or subluxation of the cervical spine. Moderate multilevel cervical disc degenerative disease. 4. Emphysema. Emphysema (ICD10-J43.9). Electronically Signed   By: Jearld Lesch M.D.   On: 07/14/2022 15:01   CT Maxillofacial Wo Contrast  Result Date: 07/14/2022 CLINICAL DATA:  Fall, trauma EXAM: CT HEAD WITHOUT CONTRAST CT MAXILLOFACIAL WITHOUT CONTRAST CT CERVICAL SPINE WITHOUT CONTRAST TECHNIQUE: Multidetector CT imaging of the head, cervical spine, and maxillofacial structures were performed using the standard protocol without intravenous contrast. Multiplanar CT image reconstructions of the cervical spine and maxillofacial structures were also generated. RADIATION DOSE REDUCTION: This exam was performed according to the departmental dose-optimization program which includes automated exposure control, adjustment of the mA and/or kV according to patient size and/or use of iterative reconstruction technique. COMPARISON:  None Available. FINDINGS: CT HEAD FINDINGS Brain: No evidence of acute infarction, hemorrhage, hydrocephalus, extra-axial collection or mass lesion/mass effect. Extensive periventricular and deep white matter hypodensity. Vascular: No hyperdense vessel or unexpected calcification. CT FACIAL BONES FINDINGS Skull: Normal. Negative for fracture or focal lesion. Facial bones: No displaced fractures or dislocations. Sinuses/Orbits: No acute finding. Other: Soft tissue contusion of the left cheek and chin. CT CERVICAL SPINE FINDINGS Alignment: Normal. Skull base and vertebrae: No acute fracture. No primary bone lesion or focal pathologic process. Soft tissues and spinal canal: No prevertebral fluid or swelling. No visible canal hematoma. Disc levels: Moderate multilevel cervical disc degenerative disease and osteophytosis, worst from C3 through C6. Upper chest: Emphysema. Other: None.  IMPRESSION: 1. No acute intracranial pathology. Small-vessel white matter disease. 2. No displaced fractures or dislocations of the facial bones. Soft tissue contusion of the left cheek and chin. 3. No fracture or subluxation of the cervical spine. Moderate multilevel cervical disc degenerative disease. 4. Emphysema. Emphysema (ICD10-J43.9). Electronically Signed   By: Jearld Lesch M.D.   On: 07/14/2022 15:01   CT Cervical Spine Wo Contrast  Result Date: 07/14/2022 CLINICAL DATA:  Fall, trauma EXAM: CT HEAD WITHOUT CONTRAST CT MAXILLOFACIAL WITHOUT CONTRAST CT CERVICAL SPINE WITHOUT CONTRAST TECHNIQUE: Multidetector CT imaging of the head, cervical spine, and maxillofacial structures were performed using the standard protocol without intravenous contrast. Multiplanar CT image reconstructions of the cervical spine and maxillofacial structures were also generated. RADIATION DOSE REDUCTION: This exam was performed according to the  departmental dose-optimization program which includes automated exposure control, adjustment of the mA and/or kV according to patient size and/or use of iterative reconstruction technique. COMPARISON:  None Available. FINDINGS: CT HEAD FINDINGS Brain: No evidence of acute infarction, hemorrhage, hydrocephalus, extra-axial collection or mass lesion/mass effect. Extensive periventricular and deep white matter hypodensity. Vascular: No hyperdense vessel or unexpected calcification. CT FACIAL BONES FINDINGS Skull: Normal. Negative for fracture or focal lesion. Facial bones: No displaced fractures or dislocations. Sinuses/Orbits: No acute finding. Other: Soft tissue contusion of the left cheek and chin. CT CERVICAL SPINE FINDINGS Alignment: Normal. Skull base and vertebrae: No acute fracture. No primary bone lesion or focal pathologic process. Soft tissues and spinal canal: No prevertebral fluid or swelling. No visible canal hematoma. Disc levels: Moderate multilevel cervical disc  degenerative disease and osteophytosis, worst from C3 through C6. Upper chest: Emphysema. Other: None. IMPRESSION: 1. No acute intracranial pathology. Small-vessel white matter disease. 2. No displaced fractures or dislocations of the facial bones. Soft tissue contusion of the left cheek and chin. 3. No fracture or subluxation of the cervical spine. Moderate multilevel cervical disc degenerative disease. 4. Emphysema. Emphysema (ICD10-J43.9). Electronically Signed   By: Jearld LeschAlex D Bibbey M.D.   On: 07/14/2022 15:01   DG Chest 1 View  Result Date: 07/14/2022 CLINICAL DATA:  Clinical fall at home, LEFT lower extremity pain EXAM: CHEST  1 VIEW COMPARISON:  01/12/2021 FINDINGS: Normal heart size and pulmonary vascularity. Atherosclerotic calcification aorta. Large hiatal hernia. LEFT basilar atelectasis. Chronic accentuation of pulmonary markings without acute infiltrate, pleural effusion, or pneumothorax. BILATERAL glenohumeral degenerative changes. IMPRESSION: Large hiatal hernia with LEFT basilar atelectasis. Aortic Atherosclerosis (ICD10-I70.0). Electronically Signed   By: Ulyses SouthwardMark  Boles M.D.   On: 07/14/2022 14:56   DG Knee Complete 4 Views Left  Result Date: 07/14/2022 CLINICAL DATA:  Mechanical fall, LEFT lower extremity pain EXAM: LEFT KNEE - COMPLETE 4+ VIEW COMPARISON:  None FINDINGS: Osseous demineralization. Components of LEFT knee prosthesis. Mildly displaced fracture distal LEFT femoral metadiaphysis extending to prosthesis. No dislocation. Visualized tibia and fibula appear intact. Question minimal joint effusion. Scattered atherosclerotic calcifications. IMPRESSION: Mildly displaced distal LEFT femoral metadiaphyseal fracture extending to knee prosthesis. Question minimal joint effusion. Electronically Signed   By: Ulyses SouthwardMark  Boles M.D.   On: 07/14/2022 14:55   DG Pelvis 1-2 Views  Result Date: 07/14/2022 CLINICAL DATA:  Mid ankle fall, LEFT lower extremity pain EXAM: PELVIS - 1-2 VIEW COMPARISON:  None  FINDINGS: Osseous demineralization. Hip and SI joint spaces preserved. Extensive deformities of the RIGHT hemipelvis from old superior and inferior pubic rami fractures. Advanced degenerative disc and facet disease changes lumbar spine with dextroconvex scoliosis. No acute fracture, dislocation, or bone destruction. Question prior hernia repair upper RIGHT pelvis. IMPRESSION:.: IMPRESSION:. Degenerative disc and facet disease changes lumbar spine with dextroconvex scoliosis. Osseous demineralization with old posttraumatic deformities of LEFT hemipelvis. No acute osseous abnormalities Electronically Signed   By: Ulyses SouthwardMark  Boles M.D.   On: 07/14/2022 14:50    Procedures Procedures   Medications Ordered in ED Medications  acetaminophen (TYLENOL) tablet 650 mg (has no administration in time range)  fentaNYL (SUBLIMAZE) injection 25 mcg (25 mcg Intravenous Given 07/14/22 1409)  ondansetron (ZOFRAN) injection 4 mg (4 mg Intravenous Given 07/14/22 1414)    ED Course/ Medical Decision Making/ A&P                           Medical Decision Making Amount and/or Complexity of Data  Reviewed Labs: ordered. Radiology: ordered.  Risk OTC drugs. Prescription drug management.   This patient presents to the ED for concern of a fall.  Differential includes but is not limited to mechanical fall, hypoglycemia, CVA, orthostatic hypotension, seizure and syncope   This is not an exhaustive differential.    Past Medical History / Co-morbidities / Social History: Polyneuropathy and multiple falls   Additional history: Per chart review patient has had visits for falls and foot drop with both internal medicine and neurology.  Last visits I am able to see RN 2018   Physical Exam: Pertinent physical exam findings include large amounts of bruising to patient's entire body.  Small abrasion to left eyebrow.  Lab Tests: I ordered, and personally interpreted labs.  The pertinent results include:  K 2.7 WBC 19.4    Imaging Studies: I ordered and independently visualized and interpreted imaging. CT head, cervical spine and maxillofacial no abnormalities Radiographs revealing a distal left femur fracture.  I agree with the radiologist interpretation.   Medications: I ordered medication including fentanyl and Zofran. Reevaluation of the patient after these medicines showed that the patient resolved. I have reviewed the patients home medicines and have made adjustments as needed.   Consultations Obtained: I discussed lab and imaging findings with Earney Hamburg with orthopedics.  Orthopedics will see the patient throughout her admission.  Disposition: 81 year old female presenting today after fall.  Not on blood thinners, does not currently meet level 2 fall criteria.  Labs revealing of a potassium of 2.7.  Patient did not volunteer recent illness however daughter reports that she complained of some chills and stomach upset within the past week.  Continues to deny vomiting or diarrhea.  Abdomen nontender.  CT imaging not ordered at this time.   After consideration of the diagnostic results and the patients response to treatment, I feel that patient requires admission for hypokalemia and continued discussions about her injury with orthopedics.  Dr. Ophelia Charter is accepting.   I discussed this case with my attending physician Dr. Lynelle Doctor who cosigned this note including patient's presenting symptoms, physical exam, and planned diagnostics and interventions. Attending physician stated agreement with plan or made changes to plan which were implemented.      Final Clinical Impression(s) / ED Diagnoses Final diagnoses:  Hypokalemia  Closed fracture of left femur, unspecified fracture morphology, unspecified portion of femur, initial encounter [facility])    Rx / DC Orders Admit to the hospital with Dr. Kieth Brightly, Gabriel Cirri, PA-C 07/14/22 1545    Linwood Dibbles, MD 07/15/22 [phone]

## 2022-07-14 NOTE — Consult Note (Signed)
Reason for Consult:Left distal femur fx Referring Physician: Linwood Dibbles Time called: 1511 Time at bedside: 1523   Darlene Hernandez is an 81 y.o. female.  HPI: Sady got up from her WC to go to the bathroom. She forgot to lock it and the chair shot out from under her and she fell. She had immediate left knee pain and could not get up. She was brought to the ED where x-rays showed a periprosthetic distal femur fx and orthopedic surgery was consulted.  Past Medical History:  Diagnosis Date   Congestive heart failure (CHF) (HCC)    Diabetes mellitus without complication (HCC)    Hip fracture (HCC)    Hypertension    Thyroid disease    Vision abnormalities     Past Surgical History:  Procedure Laterality Date   ABDOMINAL HYSTERECTOMY     HERNIA REPAIR     REPLACEMENT TOTAL KNEE Right    REPLACEMENT TOTAL KNEE BILATERAL      Family History  Problem Relation Age of Onset   Heart attack Mother    ALS Father     Social History:  reports that she has been smoking e-cigarettes. She has never used smokeless tobacco. She reports that she does not drink alcohol and does not use drugs.  Allergies:  Allergies  Allergen Reactions   Penicillins Anaphylaxis   Sulfa Antibiotics Anaphylaxis   Prednisone Other (See Comments)    "drives me up the wall and out of my mind"    Medications: I have reviewed the patient's current medications.  Results for orders placed or performed during the hospital encounter of 07/14/22 (from the past 48 hour(s))  CBC with Differential     Status: Abnormal   Collection Time: 07/14/22  1:52 PM  Result Value Ref Range   WBC 19.4 (H) 4.0 - 10.5 K/uL   RBC 4.30 3.87 - 5.11 MIL/uL   Hemoglobin 11.9 (L) 12.0 - 15.0 g/dL   HCT 81.8 29.9 - 37.1 %   MCV 84.9 80.0 - 100.0 fL   MCH 27.7 26.0 - 34.0 pg   MCHC 32.6 30.0 - 36.0 g/dL   RDW 69.6 (H) 78.9 - 38.1 %   Platelets 332 150 - 400 K/uL   nRBC 0.0 0.0 - 0.2 %   Neutrophils Relative % 90 %   Neutro Abs 17.3 (H)  1.7 - 7.7 K/uL   Lymphocytes Relative 3 %   Lymphs Abs 0.6 (L) 0.7 - 4.0 K/uL   Monocytes Relative 6 %   Monocytes Absolute 1.2 (H) 0.1 - 1.0 K/uL   Eosinophils Relative 0 %   Eosinophils Absolute 0.0 0.0 - 0.5 K/uL   Basophils Relative 0 %   Basophils Absolute 0.1 0.0 - 0.1 K/uL   Immature Granulocytes 1 %   Abs Immature Granulocytes 0.18 (H) 0.00 - 0.07 K/uL    Comment: Performed at [facility], [address] [place], [place] [address]  Basic metabolic panel     Status: Abnormal   Collection Time: 07/14/22  1:52 PM  Result Value Ref Range   Sodium 138 135 - 145 mmol/L   Potassium 2.7 (LL) 3.5 - 5.1 mmol/L    Comment: CRITICAL RESULT CALLED TO, READ BACK BY AND VERIFIED WITH D JAY,RN 1459 07/14/2022 WBOND   Chloride 94 (L) 98 - 111 mmol/L   CO2 30 22 - 32 mmol/L   Glucose, Bld 139 (H) 70 - 99 mg/dL    Comment: Glucose reference range applies only to samples  taken after fasting for at least 8 hours.   BUN 31 (H) 8 - 23 mg/dL   Creatinine, Ser 9.67 0.44 - 1.00 mg/dL   Calcium 9.3 8.9 - 89.3 mg/dL   GFR, Estimated >81 >01 mL/min    Comment: (NOTE) Calculated using the CKD-EPI Creatinine Equation (2021)    Anion gap 14 5 - 15    Comment: Performed at [facility], [address] [place], [place] [address]    CT Head Wo Contrast  Result Date: 07/14/2022 CLINICAL DATA:  Fall, trauma EXAM: CT HEAD WITHOUT CONTRAST CT MAXILLOFACIAL WITHOUT CONTRAST CT CERVICAL SPINE WITHOUT CONTRAST TECHNIQUE: Multidetector CT imaging of the head, cervical spine, and maxillofacial structures were performed using the standard protocol without intravenous contrast. Multiplanar CT image reconstructions of the cervical spine and maxillofacial structures were also generated. RADIATION DOSE REDUCTION: This exam was performed according to the departmental dose-optimization program which includes automated exposure control, adjustment of the mA and/or kV according to patient size and/or use of  iterative reconstruction technique. COMPARISON:  None Available. FINDINGS: CT HEAD FINDINGS Brain: No evidence of acute infarction, hemorrhage, hydrocephalus, extra-axial collection or mass lesion/mass effect. Extensive periventricular and deep white matter hypodensity. Vascular: No hyperdense vessel or unexpected calcification. CT FACIAL BONES FINDINGS Skull: Normal. Negative for fracture or focal lesion. Facial bones: No displaced fractures or dislocations. Sinuses/Orbits: No acute finding. Other: Soft tissue contusion of the left cheek and chin. CT CERVICAL SPINE FINDINGS Alignment: Normal. Skull base and vertebrae: No acute fracture. No primary bone lesion or focal pathologic process. Soft tissues and spinal canal: No prevertebral fluid or swelling. No visible canal hematoma. Disc levels: Moderate multilevel cervical disc degenerative disease and osteophytosis, worst from C3 through C6. Upper chest: Emphysema. Other: None. IMPRESSION: 1. No acute intracranial pathology. Small-vessel white matter disease. 2. No displaced fractures or dislocations of the facial bones. Soft tissue contusion of the left cheek and chin. 3. No fracture or subluxation of the cervical spine. Moderate multilevel cervical disc degenerative disease. 4. Emphysema. Emphysema (ICD10-J43.9). Electronically Signed   By: Jearld Lesch M.D.   On: 07/14/2022 15:01   CT Maxillofacial Wo Contrast  Result Date: 07/14/2022 CLINICAL DATA:  Fall, trauma EXAM: CT HEAD WITHOUT CONTRAST CT MAXILLOFACIAL WITHOUT CONTRAST CT CERVICAL SPINE WITHOUT CONTRAST TECHNIQUE: Multidetector CT imaging of the head, cervical spine, and maxillofacial structures were performed using the standard protocol without intravenous contrast. Multiplanar CT image reconstructions of the cervical spine and maxillofacial structures were also generated. RADIATION DOSE REDUCTION: This exam was performed according to the departmental dose-optimization program which includes  automated exposure control, adjustment of the mA and/or kV according to patient size and/or use of iterative reconstruction technique. COMPARISON:  None Available. FINDINGS: CT HEAD FINDINGS Brain: No evidence of acute infarction, hemorrhage, hydrocephalus, extra-axial collection or mass lesion/mass effect. Extensive periventricular and deep white matter hypodensity. Vascular: No hyperdense vessel or unexpected calcification. CT FACIAL BONES FINDINGS Skull: Normal. Negative for fracture or focal lesion. Facial bones: No displaced fractures or dislocations. Sinuses/Orbits: No acute finding. Other: Soft tissue contusion of the left cheek and chin. CT CERVICAL SPINE FINDINGS Alignment: Normal. Skull base and vertebrae: No acute fracture. No primary bone lesion or focal pathologic process. Soft tissues and spinal canal: No prevertebral fluid or swelling. No visible canal hematoma. Disc levels: Moderate multilevel cervical disc degenerative disease and osteophytosis, worst from C3 through C6. Upper chest: Emphysema. Other: None. IMPRESSION: 1. No acute intracranial pathology. Small-vessel white matter disease. 2. No  displaced fractures or dislocations of the facial bones. Soft tissue contusion of the left cheek and chin. 3. No fracture or subluxation of the cervical spine. Moderate multilevel cervical disc degenerative disease. 4. Emphysema. Emphysema (ICD10-J43.9). Electronically Signed   By: Jearld Lesch M.D.   On: 07/14/2022 15:01   CT Cervical Spine Wo Contrast  Result Date: 07/14/2022 CLINICAL DATA:  Fall, trauma EXAM: CT HEAD WITHOUT CONTRAST CT MAXILLOFACIAL WITHOUT CONTRAST CT CERVICAL SPINE WITHOUT CONTRAST TECHNIQUE: Multidetector CT imaging of the head, cervical spine, and maxillofacial structures were performed using the standard protocol without intravenous contrast. Multiplanar CT image reconstructions of the cervical spine and maxillofacial structures were also generated. RADIATION DOSE REDUCTION:  This exam was performed according to the departmental dose-optimization program which includes automated exposure control, adjustment of the mA and/or kV according to patient size and/or use of iterative reconstruction technique. COMPARISON:  None Available. FINDINGS: CT HEAD FINDINGS Brain: No evidence of acute infarction, hemorrhage, hydrocephalus, extra-axial collection or mass lesion/mass effect. Extensive periventricular and deep white matter hypodensity. Vascular: No hyperdense vessel or unexpected calcification. CT FACIAL BONES FINDINGS Skull: Normal. Negative for fracture or focal lesion. Facial bones: No displaced fractures or dislocations. Sinuses/Orbits: No acute finding. Other: Soft tissue contusion of the left cheek and chin. CT CERVICAL SPINE FINDINGS Alignment: Normal. Skull base and vertebrae: No acute fracture. No primary bone lesion or focal pathologic process. Soft tissues and spinal canal: No prevertebral fluid or swelling. No visible canal hematoma. Disc levels: Moderate multilevel cervical disc degenerative disease and osteophytosis, worst from C3 through C6. Upper chest: Emphysema. Other: None. IMPRESSION: 1. No acute intracranial pathology. Small-vessel white matter disease. 2. No displaced fractures or dislocations of the facial bones. Soft tissue contusion of the left cheek and chin. 3. No fracture or subluxation of the cervical spine. Moderate multilevel cervical disc degenerative disease. 4. Emphysema. Emphysema (ICD10-J43.9). Electronically Signed   By: Jearld Lesch M.D.   On: 07/14/2022 15:01   DG Chest 1 View  Result Date: 07/14/2022 CLINICAL DATA:  Clinical fall at home, LEFT lower extremity pain EXAM: CHEST  1 VIEW COMPARISON:  01/12/2021 FINDINGS: Normal heart size and pulmonary vascularity. Atherosclerotic calcification aorta. Large hiatal hernia. LEFT basilar atelectasis. Chronic accentuation of pulmonary markings without acute infiltrate, pleural effusion, or pneumothorax.  BILATERAL glenohumeral degenerative changes. IMPRESSION: Large hiatal hernia with LEFT basilar atelectasis. Aortic Atherosclerosis (ICD10-I70.0). Electronically Signed   By: Ulyses Southward M.D.   On: 07/14/2022 14:56   DG Knee Complete 4 Views Left  Result Date: 07/14/2022 CLINICAL DATA:  Mechanical fall, LEFT lower extremity pain EXAM: LEFT KNEE - COMPLETE 4+ VIEW COMPARISON:  None FINDINGS: Osseous demineralization. Components of LEFT knee prosthesis. Mildly displaced fracture distal LEFT femoral metadiaphysis extending to prosthesis. No dislocation. Visualized tibia and fibula appear intact. Question minimal joint effusion. Scattered atherosclerotic calcifications. IMPRESSION: Mildly displaced distal LEFT femoral metadiaphyseal fracture extending to knee prosthesis. Question minimal joint effusion. Electronically Signed   By: Ulyses Southward M.D.   On: 07/14/2022 14:55   DG Pelvis 1-2 Views  Result Date: 07/14/2022 CLINICAL DATA:  Mid ankle fall, LEFT lower extremity pain EXAM: PELVIS - 1-2 VIEW COMPARISON:  None FINDINGS: Osseous demineralization. Hip and SI joint spaces preserved. Extensive deformities of the RIGHT hemipelvis from old superior and inferior pubic rami fractures. Advanced degenerative disc and facet disease changes lumbar spine with dextroconvex scoliosis. No acute fracture, dislocation, or bone destruction. Question prior hernia repair upper RIGHT pelvis. IMPRESSION:.: IMPRESSION:. Degenerative disc and facet  disease changes lumbar spine with dextroconvex scoliosis. Osseous demineralization with old posttraumatic deformities of LEFT hemipelvis. No acute osseous abnormalities Electronically Signed   By: Ulyses Southward M.D.   On: 07/14/2022 14:50    Review of Systems  HENT:  Negative for ear discharge, ear pain, hearing loss and tinnitus.   Eyes:  Negative for photophobia and pain.  Respiratory:  Negative for cough and shortness of breath.   Cardiovascular:  Negative for chest pain.   Gastrointestinal:  Negative for abdominal pain, nausea and vomiting.  Genitourinary:  Negative for dysuria, flank pain, frequency and urgency.  Musculoskeletal:  Positive for arthralgias (Left knee). Negative for back pain, myalgias and neck pain.  Neurological:  Negative for dizziness and headaches.  Hematological:  Does not bruise/bleed easily.  Psychiatric/Behavioral:  The patient is not nervous/anxious.    Blood pressure 113/64, pulse 93, temperature 98 F (36.7 C), temperature source Oral, resp. rate 19, height 5\' 6"  (1.676 m), weight 61.2 kg, SpO2 91 %. Physical Exam Constitutional:      General: She is not in acute distress.    Appearance: She is well-developed. She is not diaphoretic.  HENT:     Head: Normocephalic and atraumatic.  Eyes:     General: No scleral icterus.       Right eye: No discharge.        Left eye: No discharge.     Conjunctiva/sclera: Conjunctivae normal.  Cardiovascular:     Rate and Rhythm: Normal rate and regular rhythm.  Pulmonary:     Effort: Pulmonary effort is normal. No respiratory distress.  Musculoskeletal:     Cervical back: Normal range of motion.     Comments: Left shoulder, elbow, wrist, digits- no skin wounds, mod diffuse TTP shoulder, mild crepitus, no instability, no blocks to motion  Sens  Ax/R/M/U intact  Mot   Ax/ R/ PIN/ M/ AIN/ U intact  Rad 2+  LLE No traumatic wounds, ecchymosis, or rash  Mod TTP knee  No ankle effusion  Sens DPN, SPN, TN intact  Motor EHL, ext, flex, evers 1/5  DP 2+, PT 1+, No significant edema  Skin:    General: Skin is warm and dry.  Neurological:     Mental Status: She is alert.  Psychiatric:        Mood and Affect: Mood normal.        Behavior: Behavior normal.     Assessment/Plan: Left distal femur fx -- Plan ORIF tomorrow with Dr. . Please keep NPO after MN. Left shoulder pain -- Check dedicated x-rays.    Carola Frost, PA-C Orthopedic Surgery [phone] 07/14/2022, 3:31  PM

## 2022-07-14 NOTE — ED Notes (Signed)
Bruising noted through out lt side of face and lt arm.

## 2022-07-14 NOTE — ED Notes (Signed)
Received verbal report from Lorren B RN at this time 

## 2022-07-14 NOTE — ED Triage Notes (Addendum)
Pt arrived via [facility] from home. Pt had mechanical fall r/t WC not locked resulting in recliner falling on pt. Pt hit L side of face/head, c/o LLE pain, pt A/Ox4.  BP 100/70  15mg  Ketamine.  4mg  Zofran

## 2022-07-14 NOTE — ED Notes (Signed)
Meal tray provided at this time.

## 2022-07-15 ENCOUNTER — Other Ambulatory Visit: Payer: Self-pay

## 2022-07-15 ENCOUNTER — Inpatient Hospital Stay (HOSPITAL_COMMUNITY): Payer: Medicare PPO | Admitting: Certified Registered Nurse Anesthetist

## 2022-07-15 ENCOUNTER — Encounter (HOSPITAL_COMMUNITY)
Admission: EM | Disposition: A | Discharge status: Skilled Nursing Facility | Payer: Self-pay | Source: Home / Self Care | Attending: Family Medicine

## 2022-07-15 ENCOUNTER — Inpatient Hospital Stay (HOSPITAL_COMMUNITY): Payer: Medicare PPO

## 2022-07-15 ENCOUNTER — Encounter (HOSPITAL_COMMUNITY): Payer: Self-pay | Admitting: Internal Medicine

## 2022-07-15 DIAGNOSIS — S7292XA Unspecified fracture of left femur, initial encounter for closed fracture: Secondary | ICD-10-CM | POA: Diagnosis not present

## 2022-07-15 DIAGNOSIS — I509 Heart failure, unspecified: Secondary | ICD-10-CM | POA: Diagnosis not present

## 2022-07-15 DIAGNOSIS — F17200 Nicotine dependence, unspecified, uncomplicated: Secondary | ICD-10-CM

## 2022-07-15 DIAGNOSIS — I5032 Chronic diastolic (congestive) heart failure: Secondary | ICD-10-CM | POA: Diagnosis not present

## 2022-07-15 DIAGNOSIS — J449 Chronic obstructive pulmonary disease, unspecified: Secondary | ICD-10-CM

## 2022-07-15 DIAGNOSIS — E119 Type 2 diabetes mellitus without complications: Secondary | ICD-10-CM | POA: Diagnosis not present

## 2022-07-15 DIAGNOSIS — I10 Essential (primary) hypertension: Secondary | ICD-10-CM | POA: Diagnosis not present

## 2022-07-15 DIAGNOSIS — S72402A Unspecified fracture of lower end of left femur, initial encounter for closed fracture: Secondary | ICD-10-CM

## 2022-07-15 DIAGNOSIS — I110 Hypertensive heart disease with heart failure: Secondary | ICD-10-CM

## 2022-07-15 HISTORY — PX: ORIF FEMUR FRACTURE: SHX2119

## 2022-07-15 LAB — GLUCOSE, CAPILLARY
Glucose-Capillary: 137 mg/dL — ABNORMAL HIGH (ref 70–99)
Glucose-Capillary: 120 mg/dL — ABNORMAL HIGH (ref 70–99)
Glucose-Capillary: 136 mg/dL — ABNORMAL HIGH (ref 70–99)

## 2022-07-15 LAB — CBC
HCT: 34.2 % — ABNORMAL LOW (ref 36.0–46.0)
Hemoglobin: 11.1 g/dL — ABNORMAL LOW (ref 12.0–15.0)
MCH: 27.5 pg (ref 26.0–34.0)
MCHC: 32.5 g/dL (ref 30.0–36.0)
MCV: 84.9 fL (ref 80.0–100.0)
Platelets: 326 10*3/uL (ref 150–400)
RBC: 4.03 MIL/uL (ref 3.87–5.11)
RDW: 15.6 % — ABNORMAL HIGH (ref 11.5–15.5)
WBC: 18.6 10*3/uL — ABNORMAL HIGH (ref 4.0–10.5)
nRBC: 0 % (ref 0.0–0.2)

## 2022-07-15 LAB — BASIC METABOLIC PANEL
Anion gap: 12 (ref 5–15)
BUN: 29 mg/dL — ABNORMAL HIGH (ref 8–23)
CO2: 30 mmol/L (ref 22–32)
Calcium: 9.1 mg/dL (ref 8.9–10.3)
Chloride: 95 mmol/L — ABNORMAL LOW (ref 98–111)
Creatinine, Ser: 1.03 mg/dL — ABNORMAL HIGH (ref 0.44–1.00)
GFR, Estimated: 55 mL/min — ABNORMAL LOW (ref >60)
Glucose, Bld: 150 mg/dL — ABNORMAL HIGH (ref 70–99)
Potassium: 3.1 mmol/L — ABNORMAL LOW (ref 3.5–5.1)
Sodium: 137 mmol/L (ref 135–145)

## 2022-07-15 LAB — HEMOGLOBIN A1C
Hgb A1c MFr Bld: 5.4 % (ref 4.8–5.6)
Mean Plasma Glucose: 108.28 mg/dL

## 2022-07-15 SURGERY — OPEN REDUCTION INTERNAL FIXATION (ORIF) DISTAL FEMUR FRACTURE
Anesthesia: General | Site: Leg Upper | Laterality: Left

## 2022-07-15 MED ORDER — ALBUMIN HUMAN 5 % IV SOLN: INTRAVENOUS | Status: DC | PRN

## 2022-07-15 MED ORDER — ONDANSETRON HCL 4 MG/2ML IJ SOLN
INTRAMUSCULAR | Status: DC | PRN
  Administered 2022-07-15: 4 mg via INTRAVENOUS

## 2022-07-15 MED ORDER — PHENYLEPHRINE HCL-NACL 20-0.9 MG/250ML-% IV SOLN
INTRAVENOUS | Status: DC | PRN
  Administered 2022-07-15: 80 ug/min via INTRAVENOUS

## 2022-07-15 MED ORDER — TRANEXAMIC ACID-NACL 1000-0.7 MG/100ML-% IV SOLN
1000.0000 mg | INTRAVENOUS | Status: AC
  Administered 2022-07-15: 1000 mg via INTRAVENOUS

## 2022-07-15 MED ORDER — CHLORHEXIDINE GLUCONATE 4 % EX LIQD
60.0000 mL | Freq: Once | CUTANEOUS | Status: AC
  Administered 2022-07-15: 4 via TOPICAL

## 2022-07-15 MED ORDER — POVIDONE-IODINE 10 % EX SWAB
2.0000 | Freq: Once | CUTANEOUS | Status: AC
  Administered 2022-07-15: 2 via TOPICAL

## 2022-07-15 MED ORDER — IRBESARTAN 300 MG PO TABS
300.0000 mg | ORAL_TABLET | Freq: Every day | ORAL | Status: DC
  Administered 2022-07-16: 300 mg via ORAL
  Filled 2022-07-15: qty 1

## 2022-07-15 MED ORDER — PROPOFOL 10 MG/ML IV BOLUS
INTRAVENOUS | Status: AC
  Filled 2022-07-15: qty 20

## 2022-07-15 MED ORDER — ONDANSETRON HCL 4 MG/2ML IJ SOLN: 4.0000 mg | Freq: Four times a day (QID) | INTRAMUSCULAR | Status: DC | PRN

## 2022-07-15 MED ORDER — IPRATROPIUM-ALBUTEROL 0.5-2.5 (3) MG/3ML IN SOLN
3.0000 mL | RESPIRATORY_TRACT | Status: DC | PRN
Start: 2022-07-15 — End: 2022-07-20

## 2022-07-15 MED ORDER — INSULIN ASPART 100 UNIT/ML IJ SOLN: 0.0000 [IU] | INTRAMUSCULAR | Status: DC | PRN

## 2022-07-15 MED ORDER — VANCOMYCIN HCL 1000 MG IV SOLR
INTRAVENOUS | Status: DC | PRN
  Administered 2022-07-15: 1000 mg via TOPICAL

## 2022-07-15 MED ORDER — LIDOCAINE 2% (20 MG/ML) 5 ML SYRINGE
INTRAMUSCULAR | Status: AC
Start: 2022-07-15 — End: ?
  Filled 2022-07-15: qty 5

## 2022-07-15 MED ORDER — ONDANSETRON HCL 4 MG/2ML IJ SOLN
INTRAMUSCULAR | Status: AC
  Filled 2022-07-15: qty 2

## 2022-07-15 MED ORDER — LIDOCAINE 2% (20 MG/ML) 5 ML SYRINGE
INTRAMUSCULAR | Status: DC | PRN
  Administered 2022-07-15: 60 mg via INTRAVENOUS

## 2022-07-15 MED ORDER — VANCOMYCIN HCL IN DEXTROSE 1-5 GM/200ML-% IV SOLN
1000.0000 mg | Freq: Two times a day (BID) | INTRAVENOUS | Status: AC
  Administered 2022-07-15: 1000 mg via INTRAVENOUS
  Filled 2022-07-15: qty 200

## 2022-07-15 MED ORDER — ONDANSETRON HCL 4 MG PO TABS: 4.0000 mg | ORAL_TABLET | Freq: Four times a day (QID) | ORAL | Status: DC | PRN

## 2022-07-15 MED ORDER — PHENYLEPHRINE 80 MCG/ML (10ML) SYRINGE FOR IV PUSH (FOR BLOOD PRESSURE SUPPORT)
PREFILLED_SYRINGE | INTRAVENOUS | Status: DC | PRN
  Administered 2022-07-15: 160 ug via INTRAVENOUS
  Administered 2022-07-15 (×2): 80 ug via INTRAVENOUS
  Administered 2022-07-15: 160 ug via INTRAVENOUS

## 2022-07-15 MED ORDER — FENTANYL CITRATE (PF) 250 MCG/5ML IJ SOLN
INTRAMUSCULAR | Status: DC | PRN
  Administered 2022-07-15 (×3): 50 ug via INTRAVENOUS

## 2022-07-15 MED ORDER — PROPOFOL 10 MG/ML IV BOLUS
INTRAVENOUS | Status: DC | PRN
  Administered 2022-07-15: 50 mg via INTRAVENOUS

## 2022-07-15 MED ORDER — METOCLOPRAMIDE HCL 5 MG PO TABS: 5.0000 mg | ORAL_TABLET | Freq: Three times a day (TID) | ORAL | Status: DC | PRN

## 2022-07-15 MED ORDER — LACTATED RINGERS IV SOLN: INTRAVENOUS | Status: DC | PRN

## 2022-07-15 MED ORDER — VANCOMYCIN HCL IN DEXTROSE 1-5 GM/200ML-% IV SOLN
INTRAVENOUS | Status: AC
  Administered 2022-07-15: 1000 mg via INTRAVENOUS
  Filled 2022-07-15: qty 200

## 2022-07-15 MED ORDER — SUGAMMADEX SODIUM 200 MG/2ML IV SOLN
INTRAVENOUS | Status: DC | PRN
  Administered 2022-07-15: 150 mg via INTRAVENOUS

## 2022-07-15 MED ORDER — ROCURONIUM BROMIDE 10 MG/ML (PF) SYRINGE
PREFILLED_SYRINGE | INTRAVENOUS | Status: DC | PRN
  Administered 2022-07-15: 20 mg via INTRAVENOUS
  Administered 2022-07-15: 30 mg via INTRAVENOUS

## 2022-07-15 MED ORDER — PHENYLEPHRINE 80 MCG/ML (10ML) SYRINGE FOR IV PUSH (FOR BLOOD PRESSURE SUPPORT)
PREFILLED_SYRINGE | INTRAVENOUS | Status: AC
Start: 2022-07-15 — End: ?
  Filled 2022-07-15: qty 10

## 2022-07-15 MED ORDER — VANCOMYCIN HCL 1000 MG IV SOLR
INTRAVENOUS | Status: AC
  Filled 2022-07-15: qty 20

## 2022-07-15 MED ORDER — ORAL CARE MOUTH RINSE: 15.0000 mL | OROMUCOSAL | Status: DC | PRN

## 2022-07-15 MED ORDER — HYDROCHLOROTHIAZIDE 25 MG PO TABS
25.0000 mg | ORAL_TABLET | Freq: Every day | ORAL | Status: DC
  Administered 2022-07-16: 25 mg via ORAL
  Filled 2022-07-15: qty 1

## 2022-07-15 MED ORDER — TRANEXAMIC ACID-NACL 1000-0.7 MG/100ML-% IV SOLN
INTRAVENOUS | Status: AC
  Filled 2022-07-15: qty 100

## 2022-07-15 MED ORDER — FENTANYL CITRATE (PF) 250 MCG/5ML IJ SOLN
INTRAMUSCULAR | Status: AC
  Filled 2022-07-15: qty 5

## 2022-07-15 MED ORDER — CHLORHEXIDINE GLUCONATE 0.12 % MT SOLN
OROMUCOSAL | Status: AC
  Administered 2022-07-15: 15 mL
  Filled 2022-07-15: qty 15

## 2022-07-15 MED ORDER — 0.9 % SODIUM CHLORIDE (POUR BTL) OPTIME
TOPICAL | Status: DC | PRN
  Administered 2022-07-15: 2000 mL

## 2022-07-15 MED ORDER — DOCUSATE SODIUM 100 MG PO CAPS
100.0000 mg | ORAL_CAPSULE | Freq: Two times a day (BID) | ORAL | Status: DC
  Administered 2022-07-15 – 2022-07-20 (×10): 100 mg via ORAL
  Filled 2022-07-15 (×10): qty 1

## 2022-07-15 MED ORDER — METOCLOPRAMIDE HCL 5 MG/ML IJ SOLN: 5.0000 mg | Freq: Three times a day (TID) | INTRAMUSCULAR | Status: DC | PRN

## 2022-07-15 MED ORDER — ROCURONIUM BROMIDE 10 MG/ML (PF) SYRINGE
PREFILLED_SYRINGE | INTRAVENOUS | Status: AC
Start: 2022-07-15 — End: ?
  Filled 2022-07-15: qty 10

## 2022-07-15 MED ORDER — VANCOMYCIN HCL IN DEXTROSE 1-5 GM/200ML-% IV SOLN: 1000.0000 mg | INTRAVENOUS | Status: AC

## 2022-07-15 SURGICAL SUPPLY — 82 items
BAG COUNTER SPONGE SURGICOUNT (BAG) ×1 IMPLANT
BIT DRILL 4.3 (BIT) ×1
BIT DRILL 4.3X300MM (BIT) IMPLANT
BIT DRILL LONG 3.3 (BIT) IMPLANT
BIT DRILL QC 3.3X195 (BIT) IMPLANT
BLADE CLIPPER SURG (BLADE) IMPLANT
BNDG ELASTIC 4X5.8 VLCR STR LF (GAUZE/BANDAGES/DRESSINGS) ×1 IMPLANT
BNDG ELASTIC 6X10 VLCR STRL LF (GAUZE/BANDAGES/DRESSINGS) IMPLANT
BNDG ELASTIC 6X5.8 VLCR STR LF (GAUZE/BANDAGES/DRESSINGS) ×1 IMPLANT
BNDG GAUZE ELAST 4 BULKY (GAUZE/BANDAGES/DRESSINGS) ×1 IMPLANT
BRUSH SCRUB EZ PLAIN DRY (MISCELLANEOUS) ×2 IMPLANT
CANISTER SUCT 3000ML PPV (MISCELLANEOUS) ×1 IMPLANT
CAP LOCK NCB (Cap) IMPLANT
CAST PADDING 6X4YD ST 30248 (SOFTGOODS) ×1
CAST PADDING STERILE 4X4 (CAST SUPPLIES) IMPLANT
COVER SURGICAL LIGHT HANDLE (MISCELLANEOUS) ×1 IMPLANT
DRAPE C-ARM 42X72 X-RAY (DRAPES) ×1 IMPLANT
DRAPE C-ARMOR (DRAPES) ×1 IMPLANT
DRAPE IMP U-DRAPE 54X76 (DRAPES) ×1 IMPLANT
DRAPE ORTHO SPLIT 77X108 STRL (DRAPES) ×3
DRAPE SURG ORHT 6 SPLT 77X108 (DRAPES) ×3 IMPLANT
DRAPE U-SHAPE 47X51 STRL (DRAPES) ×1 IMPLANT
DRSG ADAPTIC 3X8 NADH LF (GAUZE/BANDAGES/DRESSINGS) ×1 IMPLANT
DRSG MEPITEL 4X7.2 (GAUZE/BANDAGES/DRESSINGS) IMPLANT
DRSG PAD ABDOMINAL 8X10 ST (GAUZE/BANDAGES/DRESSINGS) ×4 IMPLANT
ELECT REM PT RETURN 9FT ADLT (ELECTROSURGICAL) ×1
ELECTRODE REM PT RTRN 9FT ADLT (ELECTROSURGICAL) ×1 IMPLANT
EVACUATOR 1/8 PVC DRAIN (DRAIN) IMPLANT
EVACUATOR 3/16  PVC DRAIN (DRAIN)
EVACUATOR 3/16 PVC DRAIN (DRAIN) IMPLANT
GAUZE SPONGE 4X4 12PLY STRL (GAUZE/BANDAGES/DRESSINGS) ×1 IMPLANT
GLOVE BIO SURGEON STRL SZ7.5 (GLOVE) ×1 IMPLANT
GLOVE BIO SURGEON STRL SZ8 (GLOVE) ×1 IMPLANT
GLOVE BIOGEL PI IND STRL 7.5 (GLOVE) ×1 IMPLANT
GLOVE BIOGEL PI IND STRL 8 (GLOVE) ×1 IMPLANT
GLOVE BIOGEL PI INDICATOR 7.5 (GLOVE) ×1
GLOVE BIOGEL PI INDICATOR 8 (GLOVE) ×1
GLOVE SURG ORTHO LTX SZ7.5 (GLOVE) ×2 IMPLANT
GOWN STRL REUS W/ TWL LRG LVL3 (GOWN DISPOSABLE) ×2 IMPLANT
GOWN STRL REUS W/ TWL XL LVL3 (GOWN DISPOSABLE) ×1 IMPLANT
GOWN STRL REUS W/TWL LRG LVL3 (GOWN DISPOSABLE) ×2
GOWN STRL REUS W/TWL XL LVL3 (GOWN DISPOSABLE) ×1
K-WIRE 2.0 (WIRE) ×4
K-WIRE FXSTD 280X2XNS SS (WIRE) ×4
KIT BASIN OR (CUSTOM PROCEDURE TRAY) ×1 IMPLANT
KIT TURNOVER KIT B (KITS) ×1 IMPLANT
KWIRE FXSTD 280X2XNS SS (WIRE) IMPLANT
NEEDLE 22X1 1/2 (OR ONLY) (NEEDLE) IMPLANT
NS IRRIG 1000ML POUR BTL (IV SOLUTION) ×1 IMPLANT
PACK TOTAL JOINT (CUSTOM PROCEDURE TRAY) ×1 IMPLANT
PACK UNIVERSAL I (CUSTOM PROCEDURE TRAY) ×1 IMPLANT
PAD ARMBOARD 7.5X6 YLW CONV (MISCELLANEOUS) ×2 IMPLANT
PAD CAST 4YDX4 CTTN HI CHSV (CAST SUPPLIES) ×1 IMPLANT
PADDING CAST COTTON 4X4 STRL (CAST SUPPLIES) ×1
PADDING CAST COTTON 6X4 ST (SOFTGOODS) IMPLANT
PADDING CAST COTTON 6X4 STRL (CAST SUPPLIES) ×1 IMPLANT
PLATE DIST FEM 12H (Plate) IMPLANT
SCREW 5.0 80MM (Screw) IMPLANT
SCREW NCB 3.5X75X5X6.2XST (Screw) IMPLANT
SCREW NCB 4.0MX34M (Screw) IMPLANT
SCREW NCB 5.0X34MM (Screw) IMPLANT
SCREW NCB 5.0X36MM (Screw) IMPLANT
SCREW NCB 5.0X38 (Screw) IMPLANT
SCREW NCB 5.0X75MM (Screw) ×1 IMPLANT
SPONGE T-LAP 18X18 ~~LOC~~+RFID (SPONGE) ×1 IMPLANT
STAPLER VISISTAT 35W (STAPLE) ×1 IMPLANT
SUCTION FRAZIER HANDLE 10FR (MISCELLANEOUS) ×1
SUCTION TUBE FRAZIER 10FR DISP (MISCELLANEOUS) ×1 IMPLANT
SUT ETHILON 2 0 FS 18 (SUTURE) IMPLANT
SUT PROLENE 0 CT 2 (SUTURE) IMPLANT
SUT PROLENE 4 0 PS 2 18 (SUTURE) IMPLANT
SUT VIC AB 0 CT1 27 (SUTURE) ×2
SUT VIC AB 0 CT1 27XBRD ANBCTR (SUTURE) ×2 IMPLANT
SUT VIC AB 1 CT1 27 (SUTURE) ×2
SUT VIC AB 1 CT1 27XBRD ANBCTR (SUTURE) ×2 IMPLANT
SUT VIC AB 2-0 CT1 27 (SUTURE) ×2
SUT VIC AB 2-0 CT1 TAPERPNT 27 (SUTURE) ×2 IMPLANT
SYR 20ML ECCENTRIC (SYRINGE) IMPLANT
TOWEL GREEN STERILE (TOWEL DISPOSABLE) ×2 IMPLANT
TOWEL GREEN STERILE FF (TOWEL DISPOSABLE) ×1 IMPLANT
TRAY FOLEY MTR SLVR 16FR STAT (SET/KITS/TRAYS/PACK) IMPLANT
WATER STERILE IRR 1000ML POUR (IV SOLUTION) ×2 IMPLANT

## 2022-07-15 NOTE — Progress Notes (Signed)
Initial Nutrition Assessment  DOCUMENTATION CODES:   Not applicable  INTERVENTION:  Ensure Enlive po BID, each supplement provides 350 kcal and 20 grams of protein. MVI with minerals daily  NUTRITION DIAGNOSIS:   Increased nutrient needs related to post-op healing, hip fracture as evidenced by estimated needs.  GOAL:   Patient will meet greater than or equal to 90% of their needs  MONITOR:   Supplement acceptance, PO intake, Labs, Weight trends  REASON FOR ASSESSMENT:   Consult Assessment of nutrition requirement/status, Hip fracture protocol  ASSESSMENT:   Pt admitted from home after a fall leading to L distal femur fx. PMH significant for CHF, DM, HTN and hypothyroidism.  S/p femur repair today.  Unsuccessful attempt to reach pt via phone call to room to obtain nutrition related history. No documented meal completions on file.   She would benefit from addition of nutrition supplements to optimize nutritional intake to promote post-op healing.   Reviewed weight history. Unfortunately, there is limited documentation of weight history on file to review. Current admit weight noted to be 61.2 kg. Will continue to monitor throughout admission.   Medications: colace, protonix, IV abx  Labs: potassium 3.1 (L), BUN 29, Cr 1.03, GFR 55, HgbA1c 5.4%, CBG's 120-137 x24 hours  NUTRITION - FOCUSED PHYSICAL EXAM: RD working remotely. Deferred to follow up.   Diet Order:   Diet Order             Diet Carb Modified Fluid consistency: Thin; Room service appropriate? Yes  Diet effective now                   EDUCATION NEEDS:   No education needs have been identified at this time  Skin:  Skin Assessment: Skin Integrity Issues: Skin Integrity Issues:: Other (Comment), Incisions Incisions: L leg (closed) Other: non-pressure R pretibial wound  Last BM:  8/16  Height:   Ht Readings from Last 1 Encounters:  07/14/22 5\' 6"  (1.676 m)    Weight:   Wt Readings from  Last 1 Encounters:  07/14/22 61.2 kg   BMI:  Body mass index is 21.79 kg/m.  Estimated Nutritional Needs:   Kcal:  1500-1700  Protein:  75-90g  Fluid:  >/=1.5L  07/16/22, RDN, LDN Clinical Nutrition

## 2022-07-15 NOTE — Anesthesia Postprocedure Evaluation (Signed)
Anesthesia Post Note  Patient: Kanasia Gayman  Procedure(s) Performed: OPEN REDUCTION INTERNAL FIXATION (ORIF) DISTAL FEMUR FRACTURE (Left: Leg Upper)     Patient location during evaluation: PACU Anesthesia Type: General Level of consciousness: awake Pain management: pain level controlled Vital Signs Assessment: post-procedure vital signs reviewed and stable Respiratory status: spontaneous breathing, nonlabored ventilation, respiratory function stable and patient connected to nasal cannula oxygen Cardiovascular status: blood pressure returned to baseline and stable Postop Assessment: no apparent nausea or vomiting Anesthetic complications: no   No notable events documented.  Last Vitals:  Vitals:   07/15/22 1513 07/15/22 2050  BP: (!) 103/59 (!) 91/52  Pulse: 86 86  Resp: 17 15  Temp: 36.9 C 37.7 C  SpO2:  100%    Last Pain:  Vitals:   07/15/22 2050  TempSrc: Oral  PainSc:                  Catheryn Bacon Britne Borelli

## 2022-07-15 NOTE — Progress Notes (Addendum)
As noted in my attestation to yesterday's note:  I discussed with the patient and her daughter the risks and benefits of surgery for her left distal femur, including the possibility of infection, nerve injury, vessel injury, wound breakdown, arthritis, symptomatic hardware, DVT/ PE, loss of motion, malunion, nonunion, and need for further surgery among others.  We also specifically discussed the elevated risk of soft tissue breakdown that could lead to amputation and the option for nonoperative management.  They acknowledged these risks and provided consent to proceed.  Hypokalemia improved but still not normalized. Will d/w Anesth.  Myrene Galas, MD [facility], [facility] [phone]

## 2022-07-15 NOTE — Anesthesia Preprocedure Evaluation (Signed)
Anesthesia Evaluation  Patient identified by MRN, date of birth, ID band Patient awake    Reviewed: Allergy & Precautions, NPO status , Patient's Chart, lab work & pertinent test results  Airway Mallampati: II  TM Distance: >3 FB Neck ROM: Full    Dental  (+) Missing,    Pulmonary COPD,  COPD inhaler, Current Smoker and Patient abstained from smoking.,    Pulmonary exam normal        Cardiovascular hypertension, Pt. 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   Abdominal   Peds  Hematology negative hematology ROS (+)   Anesthesia Other Findings left distal femur fracture  Reproductive/Obstetrics                             Anesthesia Physical Anesthesia Plan  ASA: 3  Anesthesia Plan: General   Post-op Pain Management:    Induction: Intravenous  PONV Risk Score and Plan: 2 and Ondansetron, Dexamethasone and Treatment may vary due to age or medical condition  Airway Management Planned: Oral ETT  Additional Equipment:   Intra-op Plan:   Post-operative Plan: Extubation in OR  Informed Consent: I have reviewed the patients History and Physical, chart, labs and discussed the procedure including the risks, benefits and alternatives for the proposed anesthesia with the patient or authorized representative who has indicated his/her understanding and acceptance.   Patient has DNR.  Discussed DNR with patient and Suspend DNR.   Dental advisory given  Plan Discussed with: CRNA  Anesthesia Plan Comments:         Anesthesia Quick Evaluation

## 2022-07-15 NOTE — Consult Note (Signed)
WOC Nurse Consult Note: Patient receiving care in [facility] [phone] Reason for Consult: sacral wound and RLE  Attempted to see patient and was able to look at the RLE but I could not turn the patient without assistance. When I received assistance from the NT, surgery had arrived to take the patient. WOC will FU later if patient returns from surgery before WOC leaves for the day. RLE has a small ulcer on the lower medial side of the leg and a small ulcer on the posterior side of the leg. Both are pink and measure 1 x 1. The patient currently has Xeroform gauze and foam dressing over these wounds. This is appropriate for these wounds and can be continued.   Renaldo Reel Katrinka Blazing, MSN, RN, CMSRN, Angus Seller, [facility] Wound Treatment Associate Pager [phone]

## 2022-07-15 NOTE — Transfer of Care (Signed)
Immediate Anesthesia Transfer of Care Note  Patient: Darlene Hernandez  Procedure(s) Performed: OPEN REDUCTION INTERNAL FIXATION (ORIF) DISTAL FEMUR FRACTURE (Left: Leg Upper)  Patient Location: PACU  Anesthesia Type:General  Level of Consciousness: drowsy, patient cooperative and responds to stimulation  Airway & Oxygen Therapy: Patient Spontanous Breathing and Patient connected to face mask oxygen  Post-op Assessment: Report given to RN and Post -op Vital signs reviewed and stable  Post vital signs: Reviewed and stable  Last Vitals:  Vitals Value Taken Time  BP    Temp    Pulse    Resp    SpO2      Last Pain:  Vitals:   07/15/22 0905  TempSrc: Oral  PainSc: 8          Complications: No notable events documented.

## 2022-07-15 NOTE — Anesthesia Procedure Notes (Signed)
Procedure Name: Intubation Date/Time: 07/15/2022 11:38 AM  Performed by: Shary Decamp, CRNAPre-anesthesia Checklist: Patient identified, Patient being monitored, Timeout performed, Emergency Drugs available and Suction available Patient Re-evaluated:Patient Re-evaluated prior to induction Oxygen Delivery Method: Circle System Utilized Preoxygenation: Pre-oxygenation with 100% oxygen Induction Type: IV induction Ventilation: Mask ventilation without difficulty Laryngoscope Size: Miller and 2 Grade View: Grade I Tube type: Oral Tube size: 7.0 mm Number of attempts: 1 Airway Equipment and Method: Stylet Placement Confirmation: ETT inserted through vocal cords under direct vision, positive ETCO2 and breath sounds checked- equal and bilateral Secured at: 20 cm Tube secured with: Tape Dental Injury: Teeth and Oropharynx as per pre-operative assessment

## 2022-07-15 NOTE — Plan of Care (Signed)
  Problem: Clinical Measurements: Goal: Ability to maintain clinical measurements within normal limits will improve Outcome: Progressing   

## 2022-07-15 NOTE — Progress Notes (Signed)
PROGRESS NOTE    Darlene Hernandez  YPP:[id number] DOB: [birth year] DOA: 07/14/2022 PCP: Lucianne Lei, MD   Brief Narrative: Darlene Hernandez  is a 81 y.o. female with a history of chronic diastolic heart failure, diabetes mellitus, hypertension, hyperthyroidism. Patient presented after suffering a fall and subsequent left distal femur fracture.  Assessment and Plan:  Left distal femur fracture Secondary to fall from wheelchair. Orthopedic surgery consulted with plans for operative management today.  Polyneuropathy Patient is non-ambulatory. She uses a wheelchair for mobility but transfers on her own.  Chronic diastolic heart failure Stable. On Lasix and valsartan as an outpatient as an outpatient. No evidence of fluid overload  Diabetes mellitus, type 2 Patient is on diet control only; no medication management. Most recent hemoglobin A1c of 5.4%. -Discontinue SSI  Primary hypertension Patient is on valsartan and hydrochlorothiazide as an outpatient. -Continue valsartan (irbesartan while inpatient) and hydrochlorothiazide  Hyperthyroidism Previously managed on methimazole and now not on treatment.  Hyperlipidemia -Continue Lipitor  PAD -Continue Pletal  Hypokalemia Potassium of 3.1 on BMP this morning. -Potassium supplementation  Leukocytosis Likely reactive and secondary to trauma. -CBC in AM  COPD -Continue Singulair, Breo and Duoneb  On anticoagulation -Resume Xarelto pending orthopedic surgery recommendations  Pressure injury Present on admission. Located sacrally. Wound care consulted on admission.  DVT prophylaxis: SCDs Code Status:   Code Status: DNR Family Communication: None at bedside Disposition Plan: Discharge pending orthopedic surgery recommendations in addition to eventual PT/OT recommendations. Also pending stability post-op   Consultants:  Orthopedic surgery  Procedures:  None  Antimicrobials: None    Subjective: Patient reports leg  pain. No other issues this morning.  Objective: BP (!) 96/50 (BP Location: Left Arm)   Pulse 93   Temp 97.9 F (36.6 C)   Resp 19   Ht 5\' 6"  (1.676 m)   Wt 61.2 kg   SpO2 96%   BMI 21.79 kg/m   Examination:  General exam: Appears calm and comfortable Respiratory system: Clear to auscultation. Respiratory effort normal. Cardiovascular system: S1 & S2 heard, RRR. 2/6 systolic murmur Gastrointestinal system: Abdomen is nondistended, soft and nontender. No organomegaly or masses felt. Normal bowel sounds heard. Central nervous system: Alert and oriented. No focal neurological deficits. Musculoskeletal: No edema. Left leg immobilizer noted Skin: Large area of ecchymosis of left side of face Psychiatry: Judgement and insight appear normal. Mood & affect appropriate.    Data Reviewed: I have personally reviewed following labs and imaging studies  CBC Lab Results  Component Value Date   WBC 18.6 (H) 07/15/2022   RBC 4.03 07/15/2022   HGB 11.1 (L) 07/15/2022   HCT 34.2 (L) 07/15/2022   MCV 84.9 07/15/2022   MCH 27.5 07/15/2022   PLT 326 07/15/2022   MCHC 32.5 07/15/2022   RDW 15.6 (H) 07/15/2022   LYMPHSABS 0.6 (L) 07/14/2022   MONOABS 1.2 (H) 07/14/2022   EOSABS 0.0 07/14/2022   BASOSABS 0.1 07/14/2022     Last metabolic panel Lab Results  Component Value Date   NA 137 07/15/2022   K 3.1 (L) 07/15/2022   CL 95 (L) 07/15/2022   CO2 30 07/15/2022   BUN 29 (H) 07/15/2022   CREATININE 1.03 (H) 07/15/2022   GLUCOSE 150 (H) 07/15/2022   GFRNONAA 55 (L) 07/15/2022   CALCIUM 9.1 07/15/2022   PROT 6.9 01/13/2017   LABGLOB 3.2 01/13/2017   ANIONGAP 12 07/15/2022    GFR: Estimated Creatinine Clearance: 40.8 mL/min (A) (by C-G formula based on SCr  of 1.03 mg/dL (H)).  No results found for this or any previous visit (from the past 240 hour(s)).    Radiology Studies: DG Shoulder Left  Result Date: 07/14/2022 CLINICAL DATA:  Patient arrives from home, head mechanical  fall. 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 There are subchondral cystic changes and osteophytes about the humeral head. There is osteolysis and fragmentation of the distal clavicle. IMPRESSION: 1. Advanced degenerative changes of the left shoulder with osseous remodeling of the glenoid and osteolysis and fragmentation of the distal clavicle. 2.  No definite evidence of acute fracture. Electronically Signed   By: Larose Hires D.O.   On: 07/14/2022 16:47   CT Head Wo Contrast  Result Date: 07/14/2022 CLINICAL DATA:  Fall, trauma EXAM: CT HEAD WITHOUT CONTRAST CT MAXILLOFACIAL WITHOUT CONTRAST CT CERVICAL SPINE WITHOUT CONTRAST TECHNIQUE: Multidetector CT imaging of the head, cervical spine, and maxillofacial structures were performed using the standard protocol without intravenous contrast. Multiplanar CT image reconstructions of the cervical spine and maxillofacial structures were also generated. RADIATION DOSE REDUCTION: This exam was performed according to the departmental dose-optimization program which includes automated exposure control, adjustment of the mA and/or kV according to patient size and/or use of iterative reconstruction technique. COMPARISON:  None Available. FINDINGS: CT HEAD FINDINGS Brain: No evidence of acute infarction, hemorrhage, hydrocephalus, extra-axial collection or mass lesion/mass effect. Extensive periventricular and deep white matter hypodensity. Vascular: No hyperdense vessel or unexpected calcification. CT FACIAL BONES FINDINGS Skull: Normal. Negative for fracture or focal lesion. Facial bones: No displaced fractures or dislocations. Sinuses/Orbits: No acute finding. Other: Soft tissue contusion of the left cheek and chin. CT CERVICAL SPINE FINDINGS Alignment: Normal. Skull base and vertebrae: No acute fracture.  No primary bone lesion or focal pathologic process. Soft tissues and spinal canal: No prevertebral fluid or swelling. No visible canal hematoma. Disc levels: Moderate multilevel cervical disc degenerative disease and osteophytosis, worst from C3 through C6. Upper chest: Emphysema. Other: None. IMPRESSION: 1. No acute intracranial pathology. Small-vessel white matter disease. 2. No displaced fractures or dislocations of the facial bones. Soft tissue contusion of the left cheek and chin. 3. No fracture or subluxation of the cervical spine. Moderate multilevel cervical disc degenerative disease. 4. Emphysema. Emphysema (ICD10-J43.9). Electronically Signed   By: Jearld Lesch M.D.   On: 07/14/2022 15:01   CT Maxillofacial Wo Contrast  Result Date: 07/14/2022 CLINICAL DATA:  Fall, trauma EXAM: CT HEAD WITHOUT CONTRAST CT MAXILLOFACIAL WITHOUT CONTRAST CT CERVICAL SPINE WITHOUT CONTRAST TECHNIQUE: Multidetector CT imaging of the head, cervical spine, and maxillofacial structures were performed using the standard protocol without intravenous contrast. Multiplanar CT image reconstructions of the cervical spine and maxillofacial structures were also generated. RADIATION DOSE REDUCTION: This exam was performed according to the departmental dose-optimization program which includes automated exposure control, adjustment of the mA and/or kV according to patient size and/or use of iterative reconstruction technique. COMPARISON:  None Available. FINDINGS: CT HEAD FINDINGS Brain: No evidence of acute infarction, hemorrhage, hydrocephalus, extra-axial collection or mass lesion/mass effect. Extensive periventricular and deep white matter hypodensity. Vascular: No hyperdense vessel or unexpected calcification. CT FACIAL BONES FINDINGS Skull: Normal. Negative for fracture or focal lesion. Facial bones: No displaced fractures or dislocations. Sinuses/Orbits: No acute finding. Other: Soft tissue contusion of the left cheek and chin.  CT CERVICAL SPINE FINDINGS Alignment: Normal. Skull base and  vertebrae: No acute fracture. No primary bone lesion or focal pathologic process. Soft tissues and spinal canal: No prevertebral fluid or swelling. No visible canal hematoma. Disc levels: Moderate multilevel cervical disc degenerative disease and osteophytosis, worst from C3 through C6. Upper chest: Emphysema. Other: None. IMPRESSION: 1. No acute intracranial pathology. Small-vessel white matter disease. 2. No displaced fractures or dislocations of the facial bones. Soft tissue contusion of the left cheek and chin. 3. No fracture or subluxation of the cervical spine. Moderate multilevel cervical disc degenerative disease. 4. Emphysema. Emphysema (ICD10-J43.9). Electronically Signed   By: Jearld Lesch M.D.   On: 07/14/2022 15:01   CT Cervical Spine Wo Contrast  Result Date: 07/14/2022 CLINICAL DATA:  Fall, trauma EXAM: CT HEAD WITHOUT CONTRAST CT MAXILLOFACIAL WITHOUT CONTRAST CT CERVICAL SPINE WITHOUT CONTRAST TECHNIQUE: Multidetector CT imaging of the head, cervical spine, and maxillofacial structures were performed using the standard protocol without intravenous contrast. Multiplanar CT image reconstructions of the cervical spine and maxillofacial structures were also generated. RADIATION DOSE REDUCTION: This exam was performed according to the departmental dose-optimization program which includes automated exposure control, adjustment of the mA and/or kV according to patient size and/or use of iterative reconstruction technique. COMPARISON:  None Available. FINDINGS: CT HEAD FINDINGS Brain: No evidence of acute infarction, hemorrhage, hydrocephalus, extra-axial collection or mass lesion/mass effect. Extensive periventricular and deep white matter hypodensity. Vascular: No hyperdense vessel or unexpected calcification. CT FACIAL BONES FINDINGS Skull: Normal. Negative for fracture or focal lesion. Facial bones: No displaced fractures or dislocations.  Sinuses/Orbits: No acute finding. Other: Soft tissue contusion of the left cheek and chin. CT CERVICAL SPINE FINDINGS Alignment: Normal. Skull base and vertebrae: No acute fracture. No primary bone lesion or focal pathologic process. Soft tissues and spinal canal: No prevertebral fluid or swelling. No visible canal hematoma. Disc levels: Moderate multilevel cervical disc degenerative disease and osteophytosis, worst from C3 through C6. Upper chest: Emphysema. Other: None. IMPRESSION: 1. No acute intracranial pathology. Small-vessel white matter disease. 2. No displaced fractures or dislocations of the facial bones. Soft tissue contusion of the left cheek and chin. 3. No fracture or subluxation of the cervical spine. Moderate multilevel cervical disc degenerative disease. 4. Emphysema. Emphysema (ICD10-J43.9). Electronically Signed   By: Jearld Lesch M.D.   On: 07/14/2022 15:01   DG Chest 1 View  Result Date: 07/14/2022 CLINICAL DATA:  Clinical fall at home, LEFT lower extremity pain EXAM: CHEST  1 VIEW COMPARISON:  01/12/2021 FINDINGS: Normal heart size and pulmonary vascularity. Atherosclerotic calcification aorta. Large hiatal hernia. LEFT basilar atelectasis. Chronic accentuation of pulmonary markings without acute infiltrate, pleural effusion, or pneumothorax. BILATERAL glenohumeral degenerative changes. IMPRESSION: Large hiatal hernia with LEFT basilar atelectasis. Aortic Atherosclerosis (ICD10-I70.0). Electronically Signed   By: Ulyses Southward M.D.   On: 07/14/2022 14:56   DG Knee Complete 4 Views Left  Result Date: 07/14/2022 CLINICAL DATA:  Mechanical fall, LEFT lower extremity pain EXAM: LEFT KNEE - COMPLETE 4+ VIEW COMPARISON:  None FINDINGS: Osseous demineralization. Components of LEFT knee prosthesis. Mildly displaced fracture distal LEFT femoral metadiaphysis extending to prosthesis. No dislocation. Visualized tibia and fibula appear intact. Question minimal joint effusion. Scattered  atherosclerotic calcifications. IMPRESSION: Mildly displaced distal LEFT femoral metadiaphyseal fracture extending to knee prosthesis. Question minimal joint effusion. Electronically Signed   By: Ulyses Southward M.D.   On: 07/14/2022 14:55   DG Pelvis 1-2 Views  Result Date: 07/14/2022 CLINICAL DATA:  Mid ankle fall, LEFT lower extremity pain EXAM: PELVIS - 1-2 VIEW  COMPARISON:  None FINDINGS: Osseous demineralization. Hip and SI joint spaces preserved. Extensive deformities of the RIGHT hemipelvis from old superior and inferior pubic rami fractures. Advanced degenerative disc and facet disease changes lumbar spine with dextroconvex scoliosis. No acute fracture, dislocation, or bone destruction. Question prior hernia repair upper RIGHT pelvis. IMPRESSION:.: IMPRESSION:. Degenerative disc and facet disease changes lumbar spine with dextroconvex scoliosis. Osseous demineralization with old posttraumatic deformities of LEFT hemipelvis. No acute osseous abnormalities Electronically Signed   By: Ulyses Southward M.D.   On: 07/14/2022 14:50      LOS: 1 day    Jacquelin Hawking, MD [facility] 07/15/2022, 8:47 AM   If 7PM-7AM, please contact night-coverage [email or web address]

## 2022-07-16 ENCOUNTER — Encounter (HOSPITAL_COMMUNITY): Payer: Self-pay | Admitting: Orthopedic Surgery

## 2022-07-16 DIAGNOSIS — E119 Type 2 diabetes mellitus without complications: Secondary | ICD-10-CM | POA: Diagnosis not present

## 2022-07-16 DIAGNOSIS — I10 Essential (primary) hypertension: Secondary | ICD-10-CM | POA: Diagnosis not present

## 2022-07-16 DIAGNOSIS — S7292XA Unspecified fracture of left femur, initial encounter for closed fracture: Secondary | ICD-10-CM | POA: Diagnosis not present

## 2022-07-16 DIAGNOSIS — L8990 Pressure ulcer of unspecified site, unspecified stage: Secondary | ICD-10-CM | POA: Insufficient documentation

## 2022-07-16 DIAGNOSIS — I5032 Chronic diastolic (congestive) heart failure: Secondary | ICD-10-CM | POA: Diagnosis not present

## 2022-07-16 LAB — GLUCOSE, CAPILLARY: Glucose-Capillary: 107 mg/dL — ABNORMAL HIGH (ref 70–99)

## 2022-07-16 LAB — CBC
HCT: 29.5 % — ABNORMAL LOW (ref 36.0–46.0)
Hemoglobin: 9.5 g/dL — ABNORMAL LOW (ref 12.0–15.0)
MCH: 27.2 pg (ref 26.0–34.0)
MCHC: 32.2 g/dL (ref 30.0–36.0)
MCV: 84.5 fL (ref 80.0–100.0)
Platelets: 271 10*3/uL (ref 150–400)
RBC: 3.49 MIL/uL — ABNORMAL LOW (ref 3.87–5.11)
RDW: 15.6 % — ABNORMAL HIGH (ref 11.5–15.5)
WBC: 15.3 10*3/uL — ABNORMAL HIGH (ref 4.0–10.5)
nRBC: 0 % (ref 0.0–0.2)

## 2022-07-16 LAB — COMPREHENSIVE METABOLIC PANEL
ALT: 13 U/L (ref 0–44)
AST: 29 U/L (ref 15–41)
Albumin: 2.5 g/dL — ABNORMAL LOW (ref 3.5–5.0)
Alkaline Phosphatase: 57 U/L (ref 38–126)
Anion gap: 7 (ref 5–15)
BUN: 40 mg/dL — ABNORMAL HIGH (ref 8–23)
CO2: 29 mmol/L (ref 22–32)
Calcium: 8.4 mg/dL — ABNORMAL LOW (ref 8.9–10.3)
Chloride: 97 mmol/L — ABNORMAL LOW (ref 98–111)
Creatinine, Ser: 1.21 mg/dL — ABNORMAL HIGH (ref 0.44–1.00)
GFR, Estimated: 45 mL/min — ABNORMAL LOW (ref >60)
Glucose, Bld: 123 mg/dL — ABNORMAL HIGH (ref 70–99)
Potassium: 2.9 mmol/L — ABNORMAL LOW (ref 3.5–5.1)
Sodium: 133 mmol/L — ABNORMAL LOW (ref 135–145)
Total Bilirubin: 0.6 mg/dL (ref 0.3–1.2)
Total Protein: 6 g/dL — ABNORMAL LOW (ref 6.5–8.1)

## 2022-07-16 LAB — VITAMIN D 25 HYDROXY (VIT D DEFICIENCY, FRACTURES): Vit D, 25-Hydroxy: 82.14 ng/mL (ref 30–100)

## 2022-07-16 LAB — MAGNESIUM: Magnesium: 1.8 mg/dL (ref 1.7–2.4)

## 2022-07-16 MED ORDER — SODIUM CHLORIDE 0.9 % IV BOLUS
500.0000 mL | Freq: Once | INTRAVENOUS | Status: AC
  Administered 2022-07-16: 500 mL via INTRAVENOUS

## 2022-07-16 MED ORDER — ENOXAPARIN SODIUM 30 MG/0.3ML IJ SOSY
30.0000 mg | PREFILLED_SYRINGE | INTRAMUSCULAR | Status: DC
Start: 2022-07-16 — End: 2022-07-16

## 2022-07-16 MED ORDER — RIVAROXABAN 20 MG PO TABS
20.0000 mg | ORAL_TABLET | Freq: Every day | ORAL | Status: DC
  Administered 2022-07-16 – 2022-07-19 (×4): 20 mg via ORAL
  Filled 2022-07-16 (×4): qty 1

## 2022-07-16 MED ORDER — POTASSIUM CHLORIDE CRYS ER 20 MEQ PO TBCR
40.0000 meq | EXTENDED_RELEASE_TABLET | ORAL | Status: AC
  Administered 2022-07-16 (×2): 40 meq via ORAL
  Filled 2022-07-16 (×2): qty 2

## 2022-07-16 NOTE — Progress Notes (Addendum)
Orthopaedic Trauma Service Progress Note  Patient ID: Darlene Hernandez MRN: [id number] DOB/AGE: [birth year] 81 y.o.  Subjective:  Doing well Pain much better than pre-op Was able to sleep last night   Has been up in chair for several hours   ROS As above  Objective:   VITALS:   Vitals:   07/15/22 1445 07/15/22 1513 07/15/22 2050 07/16/22 0626  BP: 112/72 (!) 103/59 (!) 91/52 (!) 88/51  Pulse: 94 86 86 79  Resp: 20 17 15 15   Temp: 98.1 F (36.7 C) 98.4 F (36.9 C) 99.9 F (37.7 C) (!) 97.5 F (36.4 C)  TempSrc:  Oral Oral Oral  SpO2: 97%  100% 97%  Weight:      Height:        Estimated body mass index is 21.79 kg/m as calculated from the following:   Height as of this encounter: 5\' 6"  (1.676 m).   Weight as of this encounter: 61.2 kg.   Intake/Output      08/17 0701 08/18 0700 08/18 0701 08/19 0700   P.O.     I.V. (mL/kg) 600 (9.8)    IV Piggyback 250    Total Intake(mL/kg) 850 (13.9)    Urine (mL/kg/hr) 250 (0.2) 800 (2.2)   Blood 100    Total Output 350 800   Net +500 -800          LABS  Results for orders placed or performed during the hospital encounter of 07/14/22 (from the past 24 hour(s))  Glucose, capillary     Status: Abnormal   Collection Time: 07/15/22  2:12 PM  Result Value Ref Range   Glucose-Capillary 136 (H) 70 - 99 mg/dL  Glucose, capillary     Status: Abnormal   Collection Time: 07/16/22  5:33 AM  Result Value Ref Range   Glucose-Capillary 107 (H) 70 - 99 mg/dL     PHYSICAL EXAM:   Gen: sitting up in chair, pleasant and cooperative  Lungs: unlabored Ext:       Left Lower Extremity   Dressings clean, dry and intact  Ext warm   Baseline neuropathy unchanged   Unable to extend toes   No ankle extension    No flexion noted either    No sensation   Excellent passive ankle motion   Edema present   No DCT      Assessment/Plan: 1 Day Post-Op      Anti-infectives (From admission, onward)    Start     Dose/Rate Route Frequency Ordered Stop   07/15/22 2100  vancomycin (VANCOCIN) IVPB 1000 mg/200 mL premix        1,000 mg 200 mL/hr over 60 Minutes Intravenous Every 12 hours 07/15/22 1530 07/15/22 2330   07/15/22 1259  vancomycin (VANCOCIN) powder  Status:  Discontinued          As needed 07/15/22 1301 07/15/22 1405   07/15/22 0930  vancomycin (VANCOCIN) IVPB 1000 mg/200 mL premix        1,000 mg 200 mL/hr over 60 Minutes Intravenous On call to O.R. 07/15/22 0830 07/15/22 1019     .  POD/HD#:68   81 year old female baseline bilateral lower extremity peripheral neuropathy, wheelchair dependency s/p fall with periprosthetic distal femur fracture  -Closed left periprosthetic distal femur fracture s/p ORIF   Nonweightbearing left leg  Unrestricted  range of motion left knee  Dressing change tomorrow  Float heels, skin check every shift  Ice and elevate for swelling and pain control  Therapies  - Pain management:  Multimodal   Minimize narcotics  - ABL anemia/Hemodynamics  CBC ordered for this morning is pending  - Medical issues   Per primary - DVT/PE prophylaxis:  Resume xarelto as per PTA meds  - ID:   Perioperative antibiotics - Metabolic Bone Disease:  Vitamin D labs ordered  Likely has osteoporosis given mechanism of injury as well as her nonambulatory status in 5 to 6 years  - Activity:  As above  - FEN/GI prophylaxis/Foley/Lines:  Regular diet, appreciate nutrition consult for additional supplementation  - Impediments to fracture healing:  Poor bone quality   - Dispo:  Orthopedic issues addressed  PT and OT evaluations  Follow-up with orthopedics in 14 days for suture removal and follow-up x-rays  Will likely need snf at dc    Darlene Latin, PA-C [phone] (C) 07/16/2022, 12:52 PM  [facility] [address] [place] [address] [phone] Val [phone]  (F)    After 5pm and on the weekends please log on to Amion, go to orthopaedics and the look under the [facility] Call for the provider(s) on call. You can also call our office at [phone] and then follow the prompts to be connected to the call team.   Patient ID: Darlene Hernandez, female   DOB: Apr 03, 1941, 81 y.o.   MRN: [id number]

## 2022-07-16 NOTE — Care Management Important Message (Signed)
Important Message  Patient Details  Name: Darlene Hernandez MRN: [id number] Date of Birth: [birth year]   Medicare Important Message Given:  Yes     Sherilyn Banker 07/16/2022, 11:24 AM

## 2022-07-16 NOTE — Evaluation (Signed)
Occupational Therapy Evaluation Patient Details Name: Darlene Hernandez MRN: [id number] DOB: July 12, 1941 Today's Date: 07/16/2022   History of Present Illness 81 y/o female presented to ED on 07/14/22 after fall from w/c. Sustained L periprosthetic distal femur fx. S/p ORIF L distal femur. PMH: T2DM, CHF, HTN   Clinical Impression   Patient is s/p L ORIF distal femur surgery resulting in functional limitations due to the deficits listed below (see OT problem list). Pt completed sit<>stand total +2 mod (A) to chair this session. RN x2 educated to drop the arm of chair and transfer back to the bed if needed. Pt could have hoyer sling placed behind pt if needed as well. Pt will likely need education on sliding board transfers to maximize indep into the chair at home.  Patient will benefit from skilled OT acutely to increase independence and safety with ADLS to allow discharge HHOT.   Pt has w/c at home but will need elevated leg rest on the L LE.      Recommendations for follow up therapy are one component of a multi-disciplinary discharge planning process, led by the attending physician.  Recommendations may be updated based on patient status, additional functional criteria and insurance authorization.   Follow Up Recommendations  Home health OT    Assistance Recommended at Discharge Intermittent Supervision/Assistance  Patient can return home with the following Assistance with cooking/housework;Assist for transportation;A little help with bathing/dressing/bathroom    Functional Status Assessment  Patient has had a recent decline in their functional status and demonstrates the ability to make significant improvements in function in a reasonable and predictable amount of time.  Equipment Recommendations  Wheelchair (measurements OT);Wheelchair cushion (measurements OT) (elevated leg rest. has a wheelchair but will need to elevate L LE)    Recommendations for Other Services       Precautions /  Restrictions Precautions Precautions: Fall Restrictions Weight Bearing Restrictions: Yes LLE Weight Bearing: Non weight bearing      Mobility Bed Mobility Overal bed mobility: Needs Assistance Bed Mobility: Supine to Sit, Rolling Rolling: Min assist   Supine to sit: +2 for physical assistance, Mod assist, HOB elevated (with pad)     General bed mobility comments: pt progressing toward R side of the bed with pt using BIL UE to help R LE toward EOB. pt noted to have edema at the ankle reports its more than normal. pt requires (A) to bring LLE toward R side. Pt with pad used to help shift weight and progress to eob sitting. pt eob sitting min guard (A)    Transfers Overall transfer level: Needs assistance Equipment used: 2 person hand held assist Transfers: Sit to/from Stand Sit to Stand: +2 physical assistance, Mod assist, From elevated surface           General transfer comment: pt transfering to the R side. pt completed sit<>stand and able to clear with pad used. pt returned to sitting and second attempt transfering to the R side to chair.      Balance Overall balance assessment: Needs assistance Sitting-balance support: Bilateral upper extremity supported, Feet supported Sitting balance-Leahy Scale: Fair                                     ADL either performed or assessed with clinical judgement   ADL Overall ADL's : Needs assistance/impaired Eating/Feeding: Modified independent   Grooming: Modified independent;Bed level   Upper Body Bathing:  Minimal assistance;Bed level   Lower Body Bathing: Maximal assistance           Toilet Transfer: +2 for physical assistance;Moderate assistance;Stand-pivot Toilet Transfer Details (indicate cue type and reason): simulated oOb to drop arm chair           General ADL Comments: pt with good return demo of oob to chair transfer with (A) to maintain NWB L LE. Pt at baseline bil LE drop foot     Vision  Baseline Vision/History: 1 Wears glasses Ability to See in Adequate Light: 2 Moderately impaired Additional Comments: pt without glasses and says I can't see a thing. Ot helping pt call daughter Darlene Hernandez to get glasses brought to [facility]     Perception     Praxis      Pertinent Vitals/Pain Pain Assessment Pain Assessment: Faces Faces Pain Scale: Hurts little more Pain Location: LLE Pain Descriptors / Indicators: Discomfort, Operative site guarding Pain Intervention(s): Monitored during session, Premedicated before session, Repositioned, Limited activity within patient's tolerance     Hand Dominance Right   Extremity/Trunk Assessment Upper Extremity Assessment Upper Extremity Assessment: Overall WFL for tasks assessed   Lower Extremity Assessment Lower Extremity Assessment: Defer to PT evaluation;RLE deficits/detail;LLE deficits/detail RLE Deficits / Details: drop foot LLE Deficits / Details: drop foot   Cervical / Trunk Assessment Cervical / Trunk Assessment: Normal   Communication Communication Communication: No difficulties   Cognition Arousal/Alertness: Awake/alert Behavior During Therapy: WFL for tasks assessed/performed Overall Cognitive Status: Within Functional Limits for tasks assessed                                       General Comments  dressing dry and intact    Exercises     Shoulder Instructions      Home Living Family/patient expects to be discharged to:: Private residence Living Arrangements: Children Available Help at Discharge: Family;Available PRN/intermittently;Personal care attendant (PCA comes 2x/week for 3 hours/day) Type of Home: House Home Access: Level entry     Home Layout: One level     Bathroom Shower/Tub: Producer, television/film/video: Handicapped height Bathroom Accessibility: Yes   Home Equipment: Shower seat;Wheelchair - Nurse, children's (2 wheels)   Additional Comments: has a small dog. has  as second dog that is in the home during the day for "dog sitting"      Prior Functioning/Environment Prior Level of Function : Needs assist             Mobility Comments: independently transfers to/from w/c ADLs Comments: Aide assists with bathing. pt during the day takes w/c into the bathroom and indep transfers to commode and back        OT Problem List: Decreased strength;Decreased activity tolerance;Impaired balance (sitting and/or standing);Decreased safety awareness;Decreased knowledge of use of DME or AE;Decreased knowledge of precautions;Pain      OT Treatment/Interventions: Self-care/ADL training;Therapeutic exercise;Energy conservation;DME and/or AE instruction;Therapeutic activities;Patient/family education;Balance training    OT Goals(Current goals can be found in the care plan section) Acute Rehab OT Goals Patient Stated Goal: to be able to doing for herself again OT Goal Formulation: With patient Time For Goal Achievement: 07/30/22 Potential to Achieve Goals: Good  OT Frequency: Min 2X/week    Co-evaluation PT/OT/SLP Co-Evaluation/Treatment: Yes Reason for Co-Treatment: For patient/therapist safety;To address functional/ADL transfers   OT goals addressed during session: ADL's and self-care;Proper use of Adaptive equipment and DME;Strengthening/ROM  AM-PAC OT "6 Clicks" Daily Activity     Outcome Measure Help from another person eating meals?: A Little Help from another person taking care of personal grooming?: A Little Help from another person toileting, which includes using toliet, bedpan, or urinal?: A Lot Help from another person bathing (including washing, rinsing, drying)?: A Lot Help from another person to put on and taking off regular upper body clothing?: A Little Help from another person to put on and taking off regular lower body clothing?: A Lot 6 Click Score: 15   End of Session Equipment Utilized During Treatment: Gait belt Nurse  Communication: Mobility status;Precautions;Weight bearing status  Activity Tolerance: Patient tolerated treatment well Patient left: in chair;with call bell/phone within reach;with chair alarm set  OT Visit Diagnosis: Unsteadiness on feet (R26.81);Muscle weakness (generalized) (M62.81)                Time:  -    Charges:  OT General Charges $OT Visit: 1 Visit OT Evaluation $OT Eval Moderate Complexity: 1 Mod   Brynn, OTR/L  Acute Rehabilitation Services Office: [phone] .   Mateo Flow 07/16/2022, 11:16 AM

## 2022-07-16 NOTE — Plan of Care (Signed)

## 2022-07-16 NOTE — Evaluation (Signed)
Physical Therapy Evaluation Patient Details Name: Darlene Hernandez MRN: [id number] DOB: [birth year] Today's Date: 07/16/2022  History of Present Illness  81 y/o female presented to ED on 07/14/22 after fall from w/c. Sustained L periprosthetic distal femur fx. S/p ORIF L distal femur. PMH: T2DM, CHF, HTN  Clinical Impression  Patient admitted with the above. PTA, patient lives with daughter and son in law and was independent for w/c transfers but has PCA 2 days/week to assist with bathing. Patient currently presents with weakness, impaired balance, decreased activity tolerance, and impaired functional mobility. Educated patient on NWB status with patient requiring cues to recall. Patient required modA+2 for sit to stand and stand pivot transfer to recliner. Educated Charity fundraiser about transfer technique to return patient to bed. Patient will benefit from skilled PT services during acute stay to address listed deficits. Recommend HHPT at this time if family able to provide necessary assistance. If unable, may need SNF at discharge.        Recommendations for follow up therapy are one component of a multi-disciplinary discharge planning process, led by the attending physician.  Recommendations may be updated based on patient status, additional functional criteria and insurance authorization.  Follow Up Recommendations Home health PT (if family unable to provide necessary care, will likely need SNF)      Assistance Recommended at Discharge Frequent or constant Supervision/Assistance  Patient can return home with the following  Two people to help with walking and/or transfers;A lot of help with bathing/dressing/bathroom;Assistance with cooking/housework;Assist for transportation    Equipment Recommendations Hospital bed;Other (comment) (slideboard)  Recommendations for Other Services       Functional Status Assessment Patient has had a recent decline in their functional status and demonstrates the ability to  make significant improvements in function in a reasonable and predictable amount of time.     Precautions / Restrictions Precautions Precautions: Fall Restrictions Weight Bearing Restrictions: Yes LLE Weight Bearing: Non weight bearing      Mobility  Bed Mobility Overal bed mobility: Needs Assistance Bed Mobility: Supine to Sit, Rolling Rolling: Min assist   Supine to sit: +2 for physical assistance, Mod assist, HOB elevated (with pad)     General bed mobility comments: pt progressing toward R side of the bed with pt using BIL UE to help R LE toward EOB. pt noted to have edema at the ankle reports its more than normal. pt requires (A) to bring LLE toward R side. Pt with pad used to help shift weight and progress to eob sitting. pt eob sitting min guard (A)    Transfers Overall transfer level: Needs assistance Equipment used: 2 person hand held assist Transfers: Sit to/from Stand Sit to Stand: +2 physical assistance, Mod assist, From elevated surface           General transfer comment: pt transfering to the R side. pt completed sit<>stand and able to clear with pad used. pt returned to sitting and second attempt transfering to the R side to chair.    Ambulation/Gait                  Stairs            Wheelchair Mobility    Modified Rankin (Stroke Patients Only)       Balance Overall balance assessment: Needs assistance Sitting-balance support: Bilateral upper extremity supported, Feet supported Sitting balance-Leahy Scale: Fair     Standing balance support: Bilateral upper extremity supported, During functional activity Standing balance-Leahy Scale: Zero  Pertinent Vitals/Pain Pain Assessment Pain Assessment: Faces Faces Pain Scale: Hurts little more Pain Location: LLE Pain Descriptors / Indicators: Discomfort, Operative site guarding Pain Intervention(s): Monitored during session, Premedicated before  session, Limited activity within patient's tolerance    Home Living Family/patient expects to be discharged to:: Private residence Living Arrangements: Children Available Help at Discharge: Family;Available PRN/intermittently;Personal care attendant (PCA comes 2x/week for 3 hours/day) Type of Home: House Home Access: Level entry       Home Layout: One level Home Equipment: Shower seat;Wheelchair - Nurse, children's (2 wheels) Additional Comments: has a small dog. has as second dog that is in the home during the day for "dog sitting"    Prior Function Prior Level of Function : Needs assist             Mobility Comments: independently transfers to/from w/c ADLs Comments: Aide assists with bathing. pt during the day takes w/c into the bathroom and indep transfers to commode and back     Hand Dominance   Dominant Hand: Right    Extremity/Trunk Assessment   Upper Extremity Assessment Upper Extremity Assessment: Overall WFL for tasks assessed    Lower Extremity Assessment Lower Extremity Assessment: RLE deficits/detail;LLE deficits/detail RLE Deficits / Details: hx of drop foot LLE Deficits / Details: hx of drop foot; s/p ORIF L distal femur    Cervical / Trunk Assessment Cervical / Trunk Assessment: Normal  Communication   Communication: No difficulties  Cognition Arousal/Alertness: Awake/alert Behavior During Therapy: WFL for tasks assessed/performed Overall Cognitive Status: Within Functional Limits for tasks assessed                                          General Comments General comments (skin integrity, edema, etc.): dressing dry and intact    Exercises     Assessment/Plan    PT Assessment Patient needs continued PT services  PT Problem List Decreased strength;Decreased activity tolerance;Decreased balance;Decreased mobility;Decreased knowledge of precautions       PT Treatment Interventions DME instruction;Functional  mobility training;Therapeutic activities;Therapeutic exercise;Balance training;Patient/family education    PT Goals (Current goals can be found in the Care Plan section)  Acute Rehab PT Goals Patient Stated Goal: to get better PT Goal Formulation: With patient Time For Goal Achievement: 07/30/22 Potential to Achieve Goals: Fair    Frequency Min 4X/week     Co-evaluation PT/OT/SLP Co-Evaluation/Treatment: Yes Reason for Co-Treatment: For patient/therapist safety;To address functional/ADL transfers PT goals addressed during session: Mobility/safety with mobility OT goals addressed during session: ADL's and self-care;Proper use of Adaptive equipment and DME;Strengthening/ROM       AM-PAC PT "6 Clicks" Mobility  Outcome Measure Help needed turning from your back to your side while in a flat bed without using bedrails?: A Little Help needed moving from lying on your back to sitting on the side of a flat bed without using bedrails?: Total Help needed moving to and from a bed to a chair (including a wheelchair)?: Total Help needed standing up from a chair using your arms (e.g., wheelchair or bedside chair)?: Total Help needed to walk in hospital room?: Total Help needed climbing 3-5 steps with a railing? : Total 6 Click Score: 8    End of Session Equipment Utilized During Treatment: Gait belt Activity Tolerance: Patient tolerated treatment well Patient left: in chair;with call bell/phone within reach;with chair alarm set Nurse Communication: Mobility status PT Visit  Diagnosis: Unsteadiness on feet (R26.81);Muscle weakness (generalized) (M62.81);Other abnormalities of gait and mobility (R26.89)    Time: 5320-2334 PT Time Calculation (min) (ACUTE ONLY): 33 min   Charges:   PT Evaluation $PT Eval Moderate Complexity: 1 Mod          Geanie Pacifico A. Dan Humphreys PT, DPT Acute Rehabilitation Services Office [phone]   Viviann Spare 07/16/2022, 12:32 PM

## 2022-07-16 NOTE — Op Note (Addendum)
07/15/2022  12:15 PM  PATIENT:  Darlene Hernandez  81 y.o. female  PRE-OPERATIVE DIAGNOSIS:   1. LEFT PERIPROSTHETIC SUPRACONDYLAR FEMUR FRACTURE 2. Skin tear anterior tibia.  POST-OPERATIVE DIAGNOSIS:   1. LEFT PERIPROSTHETIC SUPRACONDYLAR FEMUR FRACTURE 2. Skin tear anterior tibia. 3. Skin tear medial knee.  PROCEDURE:   1. OPEN REDUCTION INTERNAL FIXATION OF LEFT PERIPROSTHETIC FEMUR FRACTURE WITH BIOMET NCB PLATE 2. REPAIR OF SKIN TEARS X 2 TOTAL 9 CM  SURGEON:  Marnette Perkins, MD  PHYSICIAN ASSISTANT: ANGELA HALL, RNFA  ANESTHESIA:   GENERAL  I/O:  Total I/O In: -  Out: 800 [Urine:800]  SPECIMEN:  No Specimen  TOURNIQUET:  NONE  COMPLICATIONS: NONE  DICTATION: Note written in EPIC  DISPOSITION: TO PACU  CONDITION: STABLE  DELAY START OF DVT PROPHYLAXIS BECAUSE OF BLEEDING RISK: NO  BRIEF SUMMARY OF INDICATION FOR PROCEDURE:  Darlene Hernandez is a 81 y.o. who sustained a comminuted supracondylar femur fracture, which was quite distal and near to the femoral implant, in ground level fall from her wheelchair. The risks and benefits of surgery were discussed with the patient and family, including the possibility of infection, nerve injury, vessel injury, wound breakdown, arthritis, symptomatic hardware, DVT/ PE, loss of motion, malunion, nonunion, heart attack, stroke, death, implant loosening, and need for further surgery among others.  These risks were acknowledged and consent provided to proceed. Special mention also made of anterior pyogenic granuloma anterior knee.  BRIEF SUMMARY OF PROCEDURE:  The patient was taken to the operating room where general anesthesia was induced and after receipt of preoperative antibiotics.  The left lower extremity was prepped and draped in usual sterile fashion.  No tourniquet was used during the procedure.  A radiolucent triangle was placed underneath the femur and towel bumps to restore appropriate alignment and length. C-arm was brought  in to confirm the appropriate position of the distal incision.  This was checked on lateral as well. Pyogenic granuloma was isolated with an ioban. An incision was then made.  Dissection was carried down to the IT band.  It was split in line with the incision.  The deep protractor was placed.  Hematoma evacuated. My assistant pulled and maintained traction and dialed in the rotation for alignment.  I then introduced the Biomet NCB plate.  I placed a pin distally parallel to the femoral component and joint line of the tibial tray and then checked the position on both AP and lateral views proximally, placing a single screw.  This was tightened while adjusting distally to make sure that proper alignment was maintained throughout. Additional screws were placed distally because of her poor bone quality. After the plate was apposed distally I then placed a drill bit for the small caliber screw in the shaft. Additional standard screws were placed proximally.  Locking caps were placed over the heads of the screws distally and non of the proximal screws, after confirming appropriate position of all screws on orthogonal views.  Wounds were irrigated thoroughly and then closed in standard layered fashion using #1 Vicryl for the tensor, 0 Vicryl for the deep subcu, 2-0 Vicryl and 3-0 horizontal mattress sutures for the skin.    I then addressed to the two skin tears, the lower one over the tibia which was 4 x 3 cm and the one at the medial knee which was 3 x 3cm, taking that back into place with 4-0 prolene. A sterile gentle dressing was then applied with an Ace wrap from foot to thigh  with copious padding given her very fragile skin.   PROGNOSIS:  Because of comorbidities and poor mobility, patient is at increased risk for perioperative complications. PT/ OT to assist with transfers and unrestricted range of motion of the knee without bracing. Skin protection is paramount. Formal pharmacologic DVT prophylaxis with  Lovenox. F/u in 14 days for removal of sutures.     Darlene Hernandez. Darlene Hernandez, M.D.

## 2022-07-16 NOTE — Plan of Care (Signed)

## 2022-07-16 NOTE — Progress Notes (Signed)
PROGRESS NOTE    [name]  NOM:[id number] DOB: Dec 18, 1940 DOA: 07/14/2022 PCP: Lucianne Lei, MD   Brief Narrative: Darlene Hernandez  is a 81 y.o. female with a history of chronic diastolic heart failure, diabetes mellitus, hypertension, hyperthyroidism. Patient presented after suffering a fall and subsequent left distal femur fracture.  Assessment and Plan:  Left distal femur fracture Secondary to fall from wheelchair. Orthopedic surgery consulted with plans for operative management today.  Polyneuropathy Patient is non-ambulatory. She uses a wheelchair for mobility but transfers on her own.  Chronic diastolic heart failure Stable. On Lasix and valsartan as an outpatient as an outpatient. No evidence of fluid overload. -Daily weights  Diabetes mellitus, type 2 Patient is on diet control only; no medication management. Most recent hemoglobin A1c of 5.4%. -Continue carb modified diet  Primary hypertension Patient is on valsartan and hydrochlorothiazide as an outpatient. Patient with hypotension. -Discontinue antihypertensives -500 mL NS bolus  Hyperthyroidism Previously managed on methimazole and now not on treatment.  Hyperlipidemia -Continue Lipitor  PAD -Continue Pletal  Hypokalemia Potassium of 2.9 on BMP this afternoon. -Potassium supplementation  Leukocytosis Likely reactive and secondary to trauma. Trending down. -CBC in AM  COPD -Continue Singulair, Breo and Duoneb  History of DVT Will need to confirm Xarelto dosing and indication prior to restarting. Okay to restart per orthopedic surgery recommendations  Pressure injury Present on admission. Located sacrally. Wound care consulted on admission.  DVT prophylaxis: Xarelto Code Status:   Code Status: DNR Family Communication: None at bedside Disposition Plan: Discharge pending orthopedic surgery recommendations in addition to eventual PT/OT recommendations. Also pending stable  hemoglobin   Consultants:  Orthopedic surgery  Procedures:  ORIF left distal femur (8/17)  Antimicrobials: None    Subjective: Leg pain has significantly improved since surgery.  Objective: BP (!) 88/51 (BP Location: Left Arm)   Pulse 79   Temp (!) 97.5 F (36.4 C) (Oral)   Resp 15   Ht 5\' 6"  (1.676 m)   Wt 61.2 kg   SpO2 97%   BMI 21.79 kg/m   Examination:  General exam: Appears calm and comfortable Respiratory system: Clear to auscultation. Respiratory effort normal. Cardiovascular system: S1 & S2 heard, RRR. Gastrointestinal system: Abdomen is nondistended, soft and nontender. Normal bowel sounds heard. Central nervous system: Alert and oriented. No focal neurological deficits. Musculoskeletal:  No calf tenderness Psychiatry: Judgement and insight appear normal. Mood & affect appropriate.    Data Reviewed: I have personally reviewed following labs and imaging studies  CBC Lab Results  Component Value Date   WBC 18.6 (H) 07/15/2022   RBC 4.03 07/15/2022   HGB 11.1 (L) 07/15/2022   HCT 34.2 (L) 07/15/2022   MCV 84.9 07/15/2022   MCH 27.5 07/15/2022   PLT 326 07/15/2022   MCHC 32.5 07/15/2022   RDW 15.6 (H) 07/15/2022   LYMPHSABS 0.6 (L) 07/14/2022   MONOABS 1.2 (H) 07/14/2022   EOSABS 0.0 07/14/2022   BASOSABS 0.1 07/14/2022     Last metabolic panel Lab Results  Component Value Date   NA 137 07/15/2022   K 3.1 (L) 07/15/2022   CL 95 (L) 07/15/2022   CO2 30 07/15/2022   BUN 29 (H) 07/15/2022   CREATININE 1.03 (H) 07/15/2022   GLUCOSE 150 (H) 07/15/2022   GFRNONAA 55 (L) 07/15/2022   CALCIUM 9.1 07/15/2022   PROT 6.9 01/13/2017   LABGLOB 3.2 01/13/2017   ANIONGAP 12 07/15/2022    GFR: Estimated Creatinine Clearance: 40.8 mL/min (A) (by  C-G formula based on SCr of 1.03 mg/dL (H)).  No results found for this or any previous visit (from the past 240 hour(s)).    Radiology Studies: DG Knee Left Port  Result Date: 07/15/2022 CLINICAL  DATA:  ORIF left femur fracture EXAM: PORTABLE LEFT KNEE - 1-2 VIEW COMPARISON:  07/14/2022 FINDINGS: Interval ORIF of distal femur fracture. Lateral plate and screws are present across the fracture. Mild fracture displacement best seen on the lateral view. Left knee replacement in satisfactory position and alignment. Arterial calcification IMPRESSION: Lateral plate and screw fixation of supracondylar fracture left femur. Left knee replacement Electronically Signed   By: [name] M.D.   On: 07/15/2022 15:53   DG FEMUR MIN 2 VIEWS LEFT  Result Date: 07/15/2022 CLINICAL DATA:  Open reduction EXAM: LEFT FEMUR 2 VIEWS COMPARISON:  Plain film of 1 day prior FINDINGS: Five intraoperative images demonstrate placement of a lateral plate and screw fixation device across the previously described distal femur fracture with improved alignment and no acute hardware complication. Left knee arthroplasty is incompletely imaged. Residual lateral and posterior displacement of distal fracture fragment. IMPRESSION: Intraoperative imaging of femur fixation. Electronically Signed   By: Jeronimo Greaves M.D.   On: 07/15/2022 13:24   DG C-Arm 1-60 Min-No Report  Result Date: 07/15/2022 Fluoroscopy was utilized by the requesting physician.  No radiographic interpretation.   DG C-Arm 1-60 Min-No Report  Result Date: 07/15/2022 Fluoroscopy was utilized by the requesting physician.  No radiographic interpretation.   DG Shoulder Left  Result Date: 07/14/2022 CLINICAL DATA:  Patient arrives from home, head mechanical fall. 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 There are subchondral cystic changes and osteophytes about the humeral head. There is osteolysis and fragmentation of the distal clavicle. IMPRESSION: 1. Advanced degenerative changes of the left shoulder  with osseous remodeling of the glenoid and osteolysis and fragmentation of the distal clavicle. 2.  No definite evidence of acute fracture. Electronically Signed   By: Larose Hires D.O.   On: 07/14/2022 16:47   CT Head Wo Contrast  Result Date: 07/14/2022 CLINICAL DATA:  Fall, trauma EXAM: CT HEAD WITHOUT CONTRAST CT MAXILLOFACIAL WITHOUT CONTRAST CT CERVICAL SPINE WITHOUT CONTRAST TECHNIQUE: Multidetector CT imaging of the head, cervical spine, and maxillofacial structures were performed using the standard protocol without intravenous contrast. Multiplanar CT image reconstructions of the cervical spine and maxillofacial structures were also generated. RADIATION DOSE REDUCTION: This exam was performed according to the departmental dose-optimization program which includes automated exposure control, adjustment of the mA and/or kV according to patient size and/or use of iterative reconstruction technique. COMPARISON:  None Available. FINDINGS: CT HEAD FINDINGS Brain: No evidence of acute infarction, hemorrhage, hydrocephalus, extra-axial collection or mass lesion/mass effect. Extensive periventricular and deep white matter hypodensity. Vascular: No hyperdense vessel or unexpected calcification. CT FACIAL BONES FINDINGS Skull: Normal. Negative for fracture or focal lesion. Facial bones: No displaced fractures or dislocations. Sinuses/Orbits: No acute finding. Other: Soft tissue contusion of the left cheek and chin. CT CERVICAL SPINE FINDINGS Alignment: Normal. Skull base and vertebrae: No acute fracture. No primary bone lesion or focal pathologic process. Soft tissues and spinal canal: No prevertebral fluid or swelling. No visible canal hematoma. Disc levels: Moderate multilevel cervical disc degenerative disease and osteophytosis, worst from C3 through C6. Upper chest: Emphysema. Other: None. IMPRESSION: 1. No acute intracranial pathology.  Small-vessel white matter disease. 2. No displaced fractures or  dislocations of the facial bones. Soft tissue contusion of the left cheek and chin. 3. No fracture or subluxation of the cervical spine. Moderate multilevel cervical disc degenerative disease. 4. Emphysema. Emphysema (ICD10-J43.9). Electronically Signed   By: Jearld Lesch M.D.   On: 07/14/2022 15:01   CT Maxillofacial Wo Contrast  Result Date: 07/14/2022 CLINICAL DATA:  Fall, trauma EXAM: CT HEAD WITHOUT CONTRAST CT MAXILLOFACIAL WITHOUT CONTRAST CT CERVICAL SPINE WITHOUT CONTRAST TECHNIQUE: Multidetector CT imaging of the head, cervical spine, and maxillofacial structures were performed using the standard protocol without intravenous contrast. Multiplanar CT image reconstructions of the cervical spine and maxillofacial structures were also generated. RADIATION DOSE REDUCTION: This exam was performed according to the departmental dose-optimization program which includes automated exposure control, adjustment of the mA and/or kV according to patient size and/or use of iterative reconstruction technique. COMPARISON:  None Available. FINDINGS: CT HEAD FINDINGS Brain: No evidence of acute infarction, hemorrhage, hydrocephalus, extra-axial collection or mass lesion/mass effect. Extensive periventricular and deep white matter hypodensity. Vascular: No hyperdense vessel or unexpected calcification. CT FACIAL BONES FINDINGS Skull: Normal. Negative for fracture or focal lesion. Facial bones: No displaced fractures or dislocations. Sinuses/Orbits: No acute finding. Other: Soft tissue contusion of the left cheek and chin. CT CERVICAL SPINE FINDINGS Alignment: Normal. Skull base and vertebrae: No acute fracture. No primary bone lesion or focal pathologic process. Soft tissues and spinal canal: No prevertebral fluid or swelling. No visible canal hematoma. Disc levels: Moderate multilevel cervical disc degenerative disease and osteophytosis, worst from C3 through C6. Upper chest: Emphysema. Other: None. IMPRESSION: 1. No  acute intracranial pathology. Small-vessel white matter disease. 2. No displaced fractures or dislocations of the facial bones. Soft tissue contusion of the left cheek and chin. 3. No fracture or subluxation of the cervical spine. Moderate multilevel cervical disc degenerative disease. 4. Emphysema. Emphysema (ICD10-J43.9). Electronically Signed   By: Jearld Lesch M.D.   On: 07/14/2022 15:01   CT Cervical Spine Wo Contrast  Result Date: 07/14/2022 CLINICAL DATA:  Fall, trauma EXAM: CT HEAD WITHOUT CONTRAST CT MAXILLOFACIAL WITHOUT CONTRAST CT CERVICAL SPINE WITHOUT CONTRAST TECHNIQUE: Multidetector CT imaging of the head, cervical spine, and maxillofacial structures were performed using the standard protocol without intravenous contrast. Multiplanar CT image reconstructions of the cervical spine and maxillofacial structures were also generated. RADIATION DOSE REDUCTION: This exam was performed according to the departmental dose-optimization program which includes automated exposure control, adjustment of the mA and/or kV according to patient size and/or use of iterative reconstruction technique. COMPARISON:  None Available. FINDINGS: CT HEAD FINDINGS Brain: No evidence of acute infarction, hemorrhage, hydrocephalus, extra-axial collection or mass lesion/mass effect. Extensive periventricular and deep white matter hypodensity. Vascular: No hyperdense vessel or unexpected calcification. CT FACIAL BONES FINDINGS Skull: Normal. Negative for fracture or focal lesion. Facial bones: No displaced fractures or dislocations. Sinuses/Orbits: No acute finding. Other: Soft tissue contusion of the left cheek and chin. CT CERVICAL SPINE FINDINGS Alignment: Normal. Skull base and vertebrae: No acute fracture. No primary bone lesion or focal pathologic process. Soft tissues and spinal canal: No prevertebral fluid or swelling. No visible canal hematoma. Disc levels: Moderate multilevel cervical disc degenerative disease and  osteophytosis, worst from C3 through C6. Upper chest: Emphysema. Other: None. IMPRESSION: 1. No acute intracranial pathology. Small-vessel white matter disease. 2. No displaced fractures or dislocations of the facial bones. Soft tissue contusion of the left cheek and chin. 3. No fracture or subluxation  of the cervical spine. Moderate multilevel cervical disc degenerative disease. 4. Emphysema. Emphysema (ICD10-J43.9). Electronically Signed   By: Jearld Lesch M.D.   On: 07/14/2022 15:01   DG Chest 1 View  Result Date: 07/14/2022 CLINICAL DATA:  Clinical fall at home, LEFT lower extremity pain EXAM: CHEST  1 VIEW COMPARISON:  01/12/2021 FINDINGS: Normal heart size and pulmonary vascularity. Atherosclerotic calcification aorta. Large hiatal hernia. LEFT basilar atelectasis. Chronic accentuation of pulmonary markings without acute infiltrate, pleural effusion, or pneumothorax. BILATERAL glenohumeral degenerative changes. IMPRESSION: Large hiatal hernia with LEFT basilar atelectasis. Aortic Atherosclerosis (ICD10-I70.0). Electronically Signed   By: Ulyses Southward M.D.   On: 07/14/2022 14:56   DG Knee Complete 4 Views Left  Result Date: 07/14/2022 CLINICAL DATA:  Mechanical fall, LEFT lower extremity pain EXAM: LEFT KNEE - COMPLETE 4+ VIEW COMPARISON:  None FINDINGS: Osseous demineralization. Components of LEFT knee prosthesis. Mildly displaced fracture distal LEFT femoral metadiaphysis extending to prosthesis. No dislocation. Visualized tibia and fibula appear intact. Question minimal joint effusion. Scattered atherosclerotic calcifications. IMPRESSION: Mildly displaced distal LEFT femoral metadiaphyseal fracture extending to knee prosthesis. Question minimal joint effusion. Electronically Signed   By: Ulyses Southward M.D.   On: 07/14/2022 14:55   DG Pelvis 1-2 Views  Result Date: 07/14/2022 CLINICAL DATA:  Mid ankle fall, LEFT lower extremity pain EXAM: PELVIS - 1-2 VIEW COMPARISON:  None FINDINGS: Osseous  demineralization. Hip and SI joint spaces preserved. Extensive deformities of the RIGHT hemipelvis from old superior and inferior pubic rami fractures. Advanced degenerative disc and facet disease changes lumbar spine with dextroconvex scoliosis. No acute fracture, dislocation, or bone destruction. Question prior hernia repair upper RIGHT pelvis. IMPRESSION:.: IMPRESSION:. Degenerative disc and facet disease changes lumbar spine with dextroconvex scoliosis. Osseous demineralization with old posttraumatic deformities of LEFT hemipelvis. No acute osseous abnormalities Electronically Signed   By: Ulyses Southward M.D.   On: 07/14/2022 14:50      LOS: 2 days    Jacquelin Hawking, MD [facility] 07/16/2022, 8:57 AM   If 7PM-7AM, please contact night-coverage [email or web address]

## 2022-07-17 DIAGNOSIS — I5032 Chronic diastolic (congestive) heart failure: Secondary | ICD-10-CM | POA: Diagnosis not present

## 2022-07-17 DIAGNOSIS — I10 Essential (primary) hypertension: Secondary | ICD-10-CM | POA: Diagnosis not present

## 2022-07-17 DIAGNOSIS — E119 Type 2 diabetes mellitus without complications: Secondary | ICD-10-CM | POA: Diagnosis not present

## 2022-07-17 DIAGNOSIS — S7292XA Unspecified fracture of left femur, initial encounter for closed fracture: Secondary | ICD-10-CM | POA: Diagnosis not present

## 2022-07-17 LAB — CBC
HCT: 27.7 % — ABNORMAL LOW (ref 36.0–46.0)
Hemoglobin: 9 g/dL — ABNORMAL LOW (ref 12.0–15.0)
MCH: 27.2 pg (ref 26.0–34.0)
MCHC: 32.5 g/dL (ref 30.0–36.0)
MCV: 83.7 fL (ref 80.0–100.0)
Platelets: 270 10*3/uL (ref 150–400)
RBC: 3.31 MIL/uL — ABNORMAL LOW (ref 3.87–5.11)
RDW: 15.5 % (ref 11.5–15.5)
WBC: 11.6 10*3/uL — ABNORMAL HIGH (ref 4.0–10.5)
nRBC: 0 % (ref 0.0–0.2)

## 2022-07-17 LAB — BASIC METABOLIC PANEL
Anion gap: 8 (ref 5–15)
BUN: 47 mg/dL — ABNORMAL HIGH (ref 8–23)
CO2: 30 mmol/L (ref 22–32)
Calcium: 8.6 mg/dL — ABNORMAL LOW (ref 8.9–10.3)
Chloride: 97 mmol/L — ABNORMAL LOW (ref 98–111)
Creatinine, Ser: 1.06 mg/dL — ABNORMAL HIGH (ref 0.44–1.00)
GFR, Estimated: 53 mL/min — ABNORMAL LOW (ref >60)
Glucose, Bld: 114 mg/dL — ABNORMAL HIGH (ref 70–99)
Potassium: 4.1 mmol/L (ref 3.5–5.1)
Sodium: 135 mmol/L (ref 135–145)

## 2022-07-17 LAB — GLUCOSE, CAPILLARY: Glucose-Capillary: 95 mg/dL (ref 70–99)

## 2022-07-17 NOTE — Progress Notes (Signed)
PROGRESS NOTE    Darlene Hernandez  WUJ:[id number] DOB: [birth year] DOA: 07/14/2022 PCP: Lucianne Lei, MD   Brief Narrative: Darlene Hernandez  is a 81 y.o. female with a history of chronic diastolic heart failure, diabetes mellitus, hypertension, hyperthyroidism. Patient presented after suffering a fall and subsequent left distal femur fracture. PT/OT recommending SNF.  Assessment and Plan:  Left distal femur fracture Secondary to fall from wheelchair. Orthopedic surgery consulted with plans for operative management today.  Polyneuropathy Patient is non-ambulatory. She uses a wheelchair for mobility but transfers on her own.  Chronic diastolic heart failure Stable. On Lasix and valsartan as an outpatient as an outpatient. No evidence of fluid overload. -Daily weights  Acute anemia Secondary to fracture and likely blood loss. Hemoglobin of 11.9 on admission which has drifted down to 9 today. -Trend CBC until stable  Diabetes mellitus, type 2 Patient is on diet control only; no medication management. Most recent hemoglobin A1c of 5.4%. -Continue carb modified diet  Primary hypertension Patient is on valsartan and hydrochlorothiazide as an outpatient. Patient with hypotension which responded to IV fluids. Antihypertensives discontinued. -Continue to hold antihypertensive medication  Hyperthyroidism Previously managed on methimazole and now not on treatment.  Hyperlipidemia -Continue Lipitor  PAD -Continue Pletal  Hypokalemia Potassium low of 2.9 on BMP. Potassium now back to baseline with repletion.  Leukocytosis Likely reactive and secondary to trauma. Trending down.  COPD -Continue Singulair, Breo and Duoneb  History of DVT Will need to confirm Xarelto dosing and indication prior to restarting. Okay to restart per orthopedic surgery recommendations  Pressure injury Present on admission. Located sacrally. Wound care consulted on admission.  DVT prophylaxis:  Xarelto Code Status:   Code Status: DNR Family Communication: None at bedside Disposition Plan: Discharge to SNF when hemoglobin stabilizes.   Consultants:  Orthopedic surgery  Procedures:  ORIF left distal femur (8/17)  Antimicrobials: None    Subjective: Patient reports leg pain after being moved.  Objective: BP (!) 100/53 (BP Location: Right Arm)   Pulse 76   Temp 98 F (36.7 C)   Resp 14   Ht 5\' 6"  (1.676 m)   Wt 67.3 kg   SpO2 91%   BMI 23.95 kg/m   Examination:  General exam: Appears calm and comfortable Respiratory system: Clear to auscultation. Respiratory effort normal. Cardiovascular system: S1 & S2 heard. Irregular rhythm. Normal rate. Gastrointestinal system: Abdomen is nondistended, soft and nontender. Normal bowel sounds heard. Central nervous system: Alert and oriented. No focal neurological deficits. Psychiatry: Judgement and insight appear normal. Mood & affect appropriate.    Data Reviewed: I have personally reviewed following labs and imaging studies  CBC Lab Results  Component Value Date   WBC 11.6 (H) 07/17/2022   RBC 3.31 (L) 07/17/2022   HGB 9.0 (L) 07/17/2022   HCT 27.7 (L) 07/17/2022   MCV 83.7 07/17/2022   MCH 27.2 07/17/2022   PLT 270 07/17/2022   MCHC 32.5 07/17/2022   RDW 15.5 07/17/2022   LYMPHSABS 0.6 (L) 07/14/2022   MONOABS 1.2 (H) 07/14/2022   EOSABS 0.0 07/14/2022   BASOSABS 0.1 07/14/2022     Last metabolic panel Lab Results  Component Value Date   NA 135 07/17/2022   K 4.1 07/17/2022   CL 97 (L) 07/17/2022   CO2 30 07/17/2022   BUN 47 (H) 07/17/2022   CREATININE 1.06 (H) 07/17/2022   GLUCOSE 114 (H) 07/17/2022   GFRNONAA 53 (L) 07/17/2022   CALCIUM 8.6 (L) 07/17/2022   PROT  6.0 (L) 07/16/2022   ALBUMIN 2.5 (L) 07/16/2022   LABGLOB 3.2 01/13/2017   BILITOT 0.6 07/16/2022   ALKPHOS 57 07/16/2022   AST 29 07/16/2022   ALT 13 07/16/2022   ANIONGAP 8 07/17/2022    GFR: Estimated Creatinine Clearance:  39.6 mL/min (A) (by C-G formula based on SCr of 1.06 mg/dL (H)).  No results found for this or any previous visit (from the past 240 hour(s)).    Radiology Studies: No results found.    LOS: 3 days    Jacquelin Hawking, MD [facility] 07/17/2022, 3:58 PM   If 7PM-7AM, please contact night-coverage [email or web address]

## 2022-07-17 NOTE — Progress Notes (Signed)
[facility] Progress Note  Patient ID: Darlene Hernandez MRN: [id number] DOB/AGE: 1941-05-19 81 y.o.  Subjective:  Doing well No new complaints  Nonambulatory at baseline   ROS As above  Objective:   VITALS:   Vitals:   07/16/22 1639 07/16/22 1751 07/16/22 2004 07/17/22 0500  BP: (!) 89/59 (!) 90/50 90/61 110/68  Pulse: 81 81 89 77  Resp:      Temp:   98.1 F (36.7 C)   TempSrc:      SpO2:   93% 96%  Weight:    67.3 kg  Height:        Estimated body mass index is 23.95 kg/m as calculated from the following:   Height as of this encounter: 5\' 6"  (1.676 m).   Weight as of this encounter: 67.3 kg.   Intake/Output      08/18 0701 08/19 0700 08/19 0701 08/20 0700   P.O.  120   I.V. (mL/kg)     IV Piggyback 500.7    Total Intake(mL/kg) 500.7 (7.4) 120 (1.8)   Urine (mL/kg/hr) 1000 (0.6)    Stool 0    Blood     Total Output 1000    Net -499.3 +120        Urine Occurrence 1 x 1 x   Stool Occurrence 1 x      LABS  Results for orders placed or performed during the hospital encounter of 07/14/22 (from the past 24 hour(s))  CBC     Status: Abnormal   Collection Time: 07/16/22 12:29 PM  Result Value Ref Range   WBC 15.3 (H) 4.0 - 10.5 K/uL   RBC 3.49 (L) 3.87 - 5.11 MIL/uL   Hemoglobin 9.5 (L) 12.0 - 15.0 g/dL   HCT 07/18/22 (L) 06.2 - 37.6 %   MCV 84.5 80.0 - 100.0 fL   MCH 27.2 26.0 - 34.0 pg   MCHC 32.2 30.0 - 36.0 g/dL   RDW 28.3 (H) 15.1 - 76.1 %   Platelets 271 150 - 400 K/uL   nRBC 0.0 0.0 - 0.2 %  Comprehensive metabolic panel     Status: Abnormal   Collection Time: 07/16/22 12:29 PM  Result Value Ref Range   Sodium 133 (L) 135 - 145 mmol/L   Potassium 2.9 (L) 3.5 - 5.1 mmol/L   Chloride 97 (L) 98 - 111 mmol/L   CO2 29 22 - 32 mmol/L   Glucose, Bld 123 (H) 70 - 99 mg/dL   BUN 40 (H) 8 - 23 mg/dL   Creatinine, Ser 07/18/22 (H) 0.44 - 1.00 mg/dL   Calcium 8.4 (L) 8.9  - 10.3 mg/dL   Total Protein 6.0 (L) 6.5 - 8.1 g/dL   Albumin 2.5 (L) 3.5 - 5.0 g/dL   AST 29 15 - 41 U/L   ALT 13 0 - 44 U/L   Alkaline Phosphatase 57 38 - 126 U/L   Total Bilirubin 0.6 0.3 - 1.2 mg/dL   GFR, Estimated 45 (L) >60 mL/min   Anion gap 7 5 - 15  VITAMIN D 25 Hydroxy (Vit-D Deficiency, Fractures)     Status: None   Collection Time: 07/16/22 12:29 PM  Result Value Ref Range   Vit D, 25-Hydroxy 82.14 30 - 100 ng/mL  Magnesium     Status: None   Collection  Time: 07/16/22 12:29 PM  Result Value Ref Range   Magnesium 1.8 1.7 - 2.4 mg/dL  CBC     Status: Abnormal   Collection Time: 07/17/22  1:18 AM  Result Value Ref Range   WBC 11.6 (H) 4.0 - 10.5 K/uL   RBC 3.31 (L) 3.87 - 5.11 MIL/uL   Hemoglobin 9.0 (L) 12.0 - 15.0 g/dL   HCT 87.6 (L) 81.1 - 57.2 %   MCV 83.7 80.0 - 100.0 fL   MCH 27.2 26.0 - 34.0 pg   MCHC 32.5 30.0 - 36.0 g/dL   RDW 62.0 35.5 - 97.4 %   Platelets 270 150 - 400 K/uL   nRBC 0.0 0.0 - 0.2 %  Basic metabolic panel     Status: Abnormal   Collection Time: 07/17/22  1:18 AM  Result Value Ref Range   Sodium 135 135 - 145 mmol/L   Potassium 4.1 3.5 - 5.1 mmol/L   Chloride 97 (L) 98 - 111 mmol/L   CO2 30 22 - 32 mmol/L   Glucose, Bld 114 (H) 70 - 99 mg/dL   BUN 47 (H) 8 - 23 mg/dL   Creatinine, Ser 1.63 (H) 0.44 - 1.00 mg/dL   Calcium 8.6 (L) 8.9 - 10.3 mg/dL   GFR, Estimated 53 (L) >60 mL/min   Anion gap 8 5 - 15  Glucose, capillary     Status: None   Collection Time: 07/17/22  5:09 AM  Result Value Ref Range   Glucose-Capillary 95 70 - 99 mg/dL     PHYSICAL EXAM:   Gen: sitting up in bed, pleasant and cooperative  Lungs: unlabored Ext:       Left Lower Extremity              Dressings clean, dry and intact             Ext warm              Baseline neuropathy unchanged                         Unable to extend toes                         No ankle extension                          No flexion noted either                          No  sensation              Excellent passive ankle motion              Edema present              No DCT  Assessment/Plan: 2 Days Post-Op   Principal Problem:   Femur fracture (HCC) Active Problems:   Polyneuropathy   Chronic diastolic CHF (congestive heart failure) (HCC)   Diabetes mellitus without complication (HCC)   Hypertension   Thyroid disease   DNR (do not resuscitate)   Pressure injury of skin   Anti-infectives (From admission, onward)    Start     Dose/Rate Route Frequency Ordered Stop   07/15/22 2100  vancomycin (VANCOCIN) IVPB 1000 mg/200 mL premix        1,000 mg 200 mL/hr over 60 Minutes Intravenous Every 12  hours 07/15/22 1530 07/16/22 2126   07/15/22 1259  vancomycin (VANCOCIN) powder  Status:  Discontinued          As needed 07/15/22 1301 07/15/22 1405   07/15/22 0930  vancomycin (VANCOCIN) IVPB 1000 mg/200 mL premix        1,000 mg 200 mL/hr over 60 Minutes Intravenous On call to O.R. 07/15/22 0830 07/15/22 1019     .  POD/HD#: 63    81 year old female baseline bilateral lower extremity peripheral neuropathy, wheelchair dependency s/p fall with periprosthetic distal femur fracture   -Closed left periprosthetic distal femur fracture s/p ORIF              Nonweightbearing left leg             Unrestricted range of motion left knee             Dressing change tomorrow             Float heels, skin check every shift             Ice and elevate for swelling and pain control             Therapies   - Pain management:             Multimodal                    Minimize narcotics   - ABL anemia/Hemodynamics             CBC tomorrow   - Medical issues              Per primary - DVT/PE prophylaxis:             Resumed xarelto - ID:              Perioperative antibiotics completed   - Metabolic Bone Disease:             Vitamin D levels look good              Likely has osteoporosis given mechanism of injury as well as her nonambulatory status in 5 to  6 years   - Activity:             As above   - FEN/GI prophylaxis/Foley/Lines:             Regular diet, appreciate nutrition consult for additional supplementation   - Impediments to fracture healing:             Poor bone quality              - Dispo:             Orthopedic issues addressed             PT and OT evaluations             Follow-up with orthopedics in 14 days for suture removal and follow-up x-rays             Will likely need snf at dc      [name], PA-C [phone] (C) 07/17/2022, 12:12 PM  [facility] [address] [place] [address] [phone] Val [phone] (F)    After 5pm and on the weekends please log on to Amion, go to orthopaedics and the look under the [facility] Call for the provider(s) on call. You can also call our office at [phone] and then follow the prompts to  be connected to the call team.   Patient ID: Darlene Hernandez, female   DOB: [birth year], 81 y.o.   MRN: [id number]

## 2022-07-17 NOTE — Plan of Care (Signed)

## 2022-07-17 NOTE — Plan of Care (Signed)

## 2022-07-18 DIAGNOSIS — I10 Essential (primary) hypertension: Secondary | ICD-10-CM | POA: Diagnosis not present

## 2022-07-18 DIAGNOSIS — S7292XA Unspecified fracture of left femur, initial encounter for closed fracture: Secondary | ICD-10-CM | POA: Diagnosis not present

## 2022-07-18 DIAGNOSIS — I5032 Chronic diastolic (congestive) heart failure: Secondary | ICD-10-CM | POA: Diagnosis not present

## 2022-07-18 DIAGNOSIS — E119 Type 2 diabetes mellitus without complications: Secondary | ICD-10-CM | POA: Diagnosis not present

## 2022-07-18 LAB — CBC
HCT: 26.9 % — ABNORMAL LOW (ref 36.0–46.0)
Hemoglobin: 8.6 g/dL — ABNORMAL LOW (ref 12.0–15.0)
MCH: 27.4 pg (ref 26.0–34.0)
MCHC: 32 g/dL (ref 30.0–36.0)
MCV: 85.7 fL (ref 80.0–100.0)
Platelets: 272 10*3/uL (ref 150–400)
RBC: 3.14 MIL/uL — ABNORMAL LOW (ref 3.87–5.11)
RDW: 15.6 % — ABNORMAL HIGH (ref 11.5–15.5)
WBC: 8.5 10*3/uL (ref 4.0–10.5)
nRBC: 0 % (ref 0.0–0.2)

## 2022-07-18 LAB — GLUCOSE, CAPILLARY: Glucose-Capillary: 83 mg/dL (ref 70–99)

## 2022-07-18 NOTE — Progress Notes (Signed)
Orthopaedic Trauma Service Progress Note  Patient ID: Darlene Hernandez MRN: [id number] DOB/AGE: 80-Aug-1942 80 y.o.  Subjective:  Doing ok  No specific complaints    ROS As above  Objective:   VITALS:   Vitals:   07/17/22 0500 07/17/22 1504 07/17/22 2000 07/18/22 0500  BP: 110/68 (!) 100/53 120/71   Pulse: 77 76 75   Resp:  14 15   Temp:  98 F (36.7 C) 98.1 F (36.7 C)   TempSrc:   Oral   SpO2: 96% 91% 94%   Weight: 67.3 kg   68.2 kg  Height:        Estimated body mass index is 24.27 kg/m as calculated from the following:   Height as of this encounter: 5\' 6"  (1.676 m).   Weight as of this encounter: 68.2 kg.   Intake/Output      08/19 0701 08/20 0700 08/20 0701 08/21 0700   P.O. 240    IV Piggyback     Total Intake(mL/kg) 240 (3.5)    Urine (mL/kg/hr) 500 (0.3) 300 (0.7)   Stool     Total Output 500 300   Net -260 -300        Urine Occurrence 5 x      LABS  Results for orders placed or performed during the hospital encounter of 07/14/22 (from the past 24 hour(s))  CBC     Status: Abnormal   Collection Time: 07/18/22  3:06 AM  Result Value Ref Range   WBC 8.5 4.0 - 10.5 K/uL   RBC 3.14 (L) 3.87 - 5.11 MIL/uL   Hemoglobin 8.6 (L) 12.0 - 15.0 g/dL   HCT 07/20/22 (L) 71.0 - 62.6 %   MCV 85.7 80.0 - 100.0 fL   MCH 27.4 26.0 - 34.0 pg   MCHC 32.0 30.0 - 36.0 g/dL   RDW 94.8 (H) 54.6 - 27.0 %   Platelets 272 150 - 400 K/uL   nRBC 0.0 0.0 - 0.2 %  Glucose, capillary     Status: None   Collection Time: 07/18/22  6:42 AM  Result Value Ref Range   Glucose-Capillary 83 70 - 99 mg/dL     PHYSICAL EXAM:   Gen: sitting up in bed, pleasant and cooperative  Lungs: unlabored Ext:       Left Lower Extremity              Dressings clean, dry and intact  All dressings removed   Wounds and skin tears stable   New kerlix applied              Ext warm              Baseline neuropathy  unchanged                         Unable to extend toes                         No ankle extension                          No flexion noted either  No sensation              Excellent passive ankle motion              Edema present              No DCT  Assessment/Plan: 3 Days Post-Op    Anti-infectives (From admission, onward)    Start     Dose/Rate Route Frequency Ordered Stop   07/15/22 2100  vancomycin (VANCOCIN) IVPB 1000 mg/200 mL premix        1,000 mg 200 mL/hr over 60 Minutes Intravenous Every 12 hours 07/15/22 1530 07/16/22 2126   07/15/22 1259  vancomycin (VANCOCIN) powder  Status:  Discontinued          As needed 07/15/22 1301 07/15/22 1405   07/15/22 0930  vancomycin (VANCOCIN) IVPB 1000 mg/200 mL premix        1,000 mg 200 mL/hr over 60 Minutes Intravenous On call to O.R. 07/15/22 0830 07/15/22 1019     .  POD/HD#: 18  81 year old female baseline bilateral lower extremity peripheral neuropathy, wheelchair dependency s/p fall with periprosthetic distal femur fracture   -Closed left periprosthetic distal femur fracture s/p ORIF              Nonweightbearing left leg             Unrestricted range of motion left knee             Dressing changes as needed   Ok to shower and clean wounds with soap and water only              Float heels, skin check every shift             Ice and elevate for swelling and pain control             Therapies   - Pain management:             Multimodal                    Minimize narcotics   - ABL anemia/Hemodynamics             CBC tomorrow   - Medical issues              Per primary  - DVT/PE prophylaxis:             Resumed xarelto - ID:              Perioperative antibiotics completed    - Metabolic Bone Disease:             Vitamin D levels look good              Likely has osteoporosis given mechanism of injury as well as her nonambulatory status in 5 to 6 years   - Activity:              As above   - FEN/GI prophylaxis/Foley/Lines:             Regular diet, appreciate nutrition consult for additional supplementation   - Impediments to fracture healing:             Poor bone quality              - Dispo:             Orthopedic issues addressed             PT and  OT evaluations             Follow-up with orthopedics in 14 days for suture removal and follow-up x-rays             Will likely need snf at [place], PA-C [phone] (C) 07/18/2022, 1:14 PM  [facility] [place] [phone] Domingo Sep (F)    After 5pm and on the weekends please log on to Amion, go to orthopaedics and the look under the [facility] Call for the provider(s) on call. You can also call our office at [phone] and then follow the prompts to be connected to the call team.   Patient ID: Darlene Hernandez, female   DOB: 1941/06/02, 81 y.o.   MRN: [id number]

## 2022-07-18 NOTE — NC FL2 (Signed)
[place] MEDICAID FL2 LEVEL OF CARE SCREENING TOOL     IDENTIFICATION  Patient Name: Darlene Hernandez Birthdate: [birth year] Sex: female Admission Date (Current Location): 07/14/2022  [facility] and [place] Number:  [employer] and Address:  [facility], [address], [place], [place] [address]      Provider Number: [id number]  Attending Physician Name and Address:  Narda Bonds, MD  Relative Name and Phone Number:       Current Level of Care: Hospital Recommended Level of Care: Skilled Nursing Facility Prior Approval Number:    Date Approved/Denied:   PASRR Number: [id number]  Discharge Plan: SNF    Current Diagnoses: Patient Active Problem List   Diagnosis Date Noted   Pressure injury of skin 07/16/2022   Femur fracture (HCC) 07/14/2022   Chronic diastolic CHF (congestive heart failure) (HCC) 07/14/2022   Diabetes mellitus without complication (HCC) 07/14/2022   Hypertension 07/14/2022   Thyroid disease 07/14/2022   DNR (do not resuscitate) 07/14/2022   Polyneuropathy 01/13/2017   Numbness 01/13/2017   Bilateral foot-drop 01/13/2017   Frequent falls 01/13/2017   Progressive focal motor weakness 01/13/2017    Orientation RESPIRATION BLADDER Height & Weight     Self, Place  Normal Incontinent Weight: 68.2 kg Height:  5\' 6"  (167.6 cm)  BEHAVIORAL SYMPTOMS/MOOD NEUROLOGICAL BOWEL NUTRITION STATUS      Continent Diet  AMBULATORY STATUS COMMUNICATION OF NEEDS Skin   Total Care (wheelchair transfers at baseline) Verbally Surgical wounds, Skin abrasions, PU Stage and Appropriate Care   PU Stage 2 Dressing: Daily                   Personal Care Assistance Level of Assistance  Bathing, Feeding, Dressing Bathing Assistance: Maximum assistance Feeding assistance: Limited assistance Dressing Assistance: Maximum assistance     Functional Limitations Info  Sight, Hearing, Speech Sight Info: Adequate Hearing Info:  Adequate Speech Info: Adequate    SPECIAL CARE FACTORS FREQUENCY  PT (By licensed PT), OT (By licensed OT)     PT Frequency: PT at SNF to eval and treat a min of 5 times/week OT Frequency: OT at SNF to eval and treat a min of 5 times/week            Contractures Contractures Info: Not present    Additional Factors Info  Allergies, Code Status Code Status Info: DNR Allergies Info: penicillin, sulfa, prednisone           Current Medications (07/18/2022):  This is the current hospital active medication list Current Facility-Administered Medications  Medication Dose Route Frequency Provider Last Rate Last Admin   atorvastatin (LIPITOR) tablet 20 mg  20 mg Oral Daily 07/20/2022, MD   20 mg at 07/18/22 1033   bisacodyl (DULCOLAX) EC tablet 5 mg  5 mg Oral Daily PRN 07/20/22, MD       cilostazol (PLETAL) tablet 50 mg  50 mg Oral BID Jonah Blue, MD   50 mg at 07/18/22 1033   docusate sodium (COLACE) capsule 100 mg  100 mg Oral BID 07/20/22, PA-C   100 mg at 07/18/22 1033   fluticasone furoate-vilanterol (BREO ELLIPTA) 100-25 MCG/ACT 1 puff  1 puff Inhalation Daily 07/20/22, MD   1 puff at 07/18/22 1037   ipratropium-albuterol (DUONEB) 0.5-2.5 (3) MG/3ML nebulizer solution 3 mL  3 mL Nebulization Q4H PRN 07/20/22, MD       methocarbamol (ROBAXIN) tablet 500 mg  500 mg  Oral Q6H PRN Jonah Blue, MD   500 mg at 07/18/22 0011   Or   methocarbamol (ROBAXIN) 500 mg in dextrose 5 % 50 mL IVPB  500 mg Intravenous Q6H PRN Jonah Blue, MD       metoCLOPramide (REGLAN) tablet 5-10 mg  5-10 mg Oral Q8H PRN Montez Morita, PA-C       Or   metoCLOPramide (REGLAN) injection 5-10 mg  5-10 mg Intravenous Q8H PRN Montez Morita, PA-C       montelukast (SINGULAIR) tablet 10 mg  10 mg Oral Daily Jonah Blue, MD   10 mg at 07/18/22 1033   morphine (PF) 2 MG/ML injection 2 mg  2 mg Intravenous Q2H PRN Jonah Blue, MD   2 mg at 07/15/22 0439   ondansetron  (ZOFRAN) tablet 4 mg  4 mg Oral Q6H PRN Montez Morita, PA-C       Or   ondansetron [facility]) injection 4 mg  4 mg Intravenous Q6H PRN Montez Morita, PA-C       Oral care mouth rinse  15 mL Mouth Rinse PRN Jonah Blue, MD       oxyCODONE (Oxy IR/ROXICODONE) immediate release tablet 5-10 mg  5-10 mg Oral Q4H PRN Jonah Blue, MD   10 mg at 07/18/22 1033   pantoprazole (PROTONIX) EC tablet 40 mg  40 mg Oral Daily Jonah Blue, MD   40 mg at 07/18/22 1033   polyethylene glycol (MIRALAX / GLYCOLAX) packet 17 g  17 g Oral Daily PRN Jonah Blue, MD       rivaroxaban Carlena Hurl) tablet 20 mg  20 mg Oral Q supper Narda Bonds, MD   20 mg at 07/17/22 1735   sertraline (ZOLOFT) tablet 50 mg  50 mg Oral Daily Jonah Blue, MD   50 mg at 07/18/22 1033   traZODone (DESYREL) tablet 25-50 mg  25-50 mg Oral QHS PRN Jonah Blue, MD   25 mg at 07/18/22 0011     Discharge Medications: Please see discharge summary for a list of discharge medications.  Relevant Imaging Results:  Relevant Lab Results:   Additional Information SSN [id number]  Bess Kinds, RN

## 2022-07-18 NOTE — TOC Initial Note (Addendum)
Transition of Care [facility]) - Initial/Assessment Note    Patient Details  Name: Darlene Hernandez MRN: [id number] Date of Birth: [birth year]  [facility]) CM/SW Contact:    Bess Kinds, RN Phone Number: [phone] 07/18/2022, 3:47 PM  Clinical Narrative:                  Spoke with patient's daughter, Darlene Hernandez, at [phone] to discuss post acute transition. [name] is unable to bring patient home from the hospital without patient having rehab first. Donna's goal is that patient will get her strength back and be able to transition back home. Discussed process of SNF transition and that she would be offered choice of bed offers. Advised that [facility] was following for [facility] needs once she transitioned home.   UPDATE: FL2, clinical notes faxed to facilities. Pending bed offers and will need insurance authorization once facility selected.  Expected Discharge Plan: Skilled Nursing Facility Barriers to Discharge: Continued Medical Work up   Patient Goals and CMS Choice Patient states their goals for this hospitalization and ongoing recovery are:: first skilled rehab thne back home with daughter and son in law CMS Medicare.gov Compare Post Acute Care list provided to:: Patient Represenative (must comment) Darlene Hernandez (daughter)) Choice offered to / list presented to : Adult Children  Expected Discharge Plan and Services Expected Discharge Plan: Skilled Nursing Facility In-house Referral: Clinical Social Work Discharge Planning Services: CM Consult Post Acute Care Choice: Skilled Nursing Facility Living arrangements for the past 2 months: Single Family Home                           HH Arranged: PT, OT HH Agency: [facility] Date [facility] Agency Contacted: 07/18/22 Time HH Agency Contacted: 1547 Representative spoke with at [facility] Agency: Kandee Keen  Prior Living Arrangements/Services Living arrangements for the past 2 months: Single Family Home Lives with:: Self, Adult  Children          Need for Family Participation in Patient Care: Yes (Comment) Care giver support system in place?: Yes (comment) Current home services: DME (wheelchair) Criminal Activity/Legal Involvement Pertinent to Current Situation/Hospitalization: No - Comment as needed  Activities of Daily Living Home Assistive Devices/Equipment: Wheelchair ADL Screening (condition at time of admission) Patient's cognitive ability adequate to safely complete daily activities?: No Is the patient deaf or have difficulty hearing?: No Does the patient have difficulty seeing, even when wearing glasses/contacts?: No Does the patient have difficulty concentrating, remembering, or making decisions?: Yes Patient able to express need for assistance with ADLs?: Yes Does the patient have difficulty dressing or bathing?: Yes Independently performs ADLs?: Yes (appropriate for developmental age) Does the patient have difficulty walking or climbing stairs?: Yes Weakness of Legs: Both Weakness of Arms/Hands: Both  Permission Sought/Granted                  Emotional Assessment       Orientation: : Oriented to Self, Oriented to Place Alcohol / Substance Use: Not Applicable Psych Involvement: No (comment)  Admission diagnosis:  Hypokalemia [E87.6] Femur fracture (HCC) [S72.90XA] Closed fracture of left femur, unspecified fracture morphology, unspecified portion of femur, initial encounter (HCC) [S72.92XA] Patient Active Problem List   Diagnosis Date Noted   Pressure injury of skin 07/16/2022   Femur fracture (HCC) 07/14/2022   Chronic diastolic CHF (congestive heart failure) (HCC) 07/14/2022   Diabetes mellitus without complication (HCC) 07/14/2022   Hypertension 07/14/2022   Thyroid  disease 07/14/2022   DNR (do not resuscitate) 07/14/2022   Polyneuropathy 01/13/2017   Numbness 01/13/2017   Bilateral foot-drop 01/13/2017   Frequent falls 01/13/2017   Progressive focal motor weakness  01/13/2017   PCP:  Lucianne Lei, MD Pharmacy:   [facility] [phone] - [place], [place] - [address] AT [facility] [address] [place] [address] Phone: [phone] Fax: [phone]     Social Determinants of Health (SDOH) Interventions    Readmission Risk Interventions     No data to display

## 2022-07-18 NOTE — Progress Notes (Signed)
PROGRESS NOTE    Darlene Hernandez  PIR:[id number] DOB: [birth year] DOA: 07/14/2022 PCP: Lucianne Lei, MD   Brief Narrative: Darlene Hernandez  is a 81 y.o. female with a history of chronic diastolic heart failure, diabetes mellitus, hypertension, hyperthyroidism. Patient presented after suffering a fall and subsequent left distal femur fracture. PT/OT recommending SNF.  Assessment and Plan:  Left distal femur fracture Secondary to fall from wheelchair. Orthopedic surgery consulted and performed ORIF on 8/17. Orthopedic surgery recommendations for RLE non-weightbearing, unrestricted movement of left knee. PT/OT recommending SNF.  Polyneuropathy Patient is non-ambulatory. She uses a wheelchair for mobility but transfers on her own.  Chronic diastolic heart failure Stable. On Lasix and valsartan as an outpatient as an outpatient. No evidence of fluid overload. -Daily weights  Acute anemia Secondary to fracture and likely blood loss. Hemoglobin of 11.9 on admission which has drifted down to 8.6 -Trend CBC until stable  Diabetes mellitus, type 2 Patient is on diet control only; no medication management. Most recent hemoglobin A1c of 5.4%. -Continue carb modified diet  Primary hypertension Patient is on valsartan and hydrochlorothiazide as an outpatient. Patient with hypotension which responded to IV fluids. Antihypertensives discontinued. -Continue to hold antihypertensive medication  Hyperthyroidism Previously managed on methimazole and now not on treatment.  Hyperlipidemia -Continue Lipitor  PAD -Continue Pletal  Hypokalemia Potassium low of 2.9 on BMP. Potassium now back to baseline with repletion.  Leukocytosis Likely reactive and secondary to trauma. Trending down.  COPD -Continue Singulair, Breo and Duoneb  History of DVT Will need to confirm Xarelto dosing and indication prior to restarting. Okay to restart per orthopedic surgery recommendations  Pressure  injury Present on admission. Located sacrally. Wound care consulted on admission.   DVT prophylaxis: Xarelto Code Status:   Code Status: DNR Family Communication: None at bedside Disposition Plan: Discharge to SNF when hemoglobin stabilizes.   Consultants:  Orthopedic surgery  Procedures:  ORIF left distal femur (8/17)  Antimicrobials: None    Subjective: No significant issues at this time. Pain is better controlled.  Objective: BP 120/71 (BP Location: Right Arm)   Pulse 75   Temp 98.1 F (36.7 C) (Oral)   Resp 15   Ht 5\' 6"  (1.676 m)   Wt 68.2 kg   SpO2 94%   BMI 24.27 kg/m   Examination:  General exam: Appears calm and comfortable Respiratory system: Clear to auscultation. Respiratory effort normal. Cardiovascular system: S1 & S2 heard, RRR. Gastrointestinal system: Abdomen is nondistended, soft and nontender. Normal bowel sounds heard. Central nervous system: Alert and oriented. No focal neurological deficits. Musculoskeletal: No edema. No calf tenderness Skin: No cyanosis. No rashes Psychiatry: Judgement and insight appear normal. Mood & affect appropriate.    Data Reviewed: I have personally reviewed following labs and imaging studies  CBC Lab Results  Component Value Date   WBC 8.5 07/18/2022   RBC 3.14 (L) 07/18/2022   HGB 8.6 (L) 07/18/2022   HCT 26.9 (L) 07/18/2022   MCV 85.7 07/18/2022   MCH 27.4 07/18/2022   PLT 272 07/18/2022   MCHC 32.0 07/18/2022   RDW 15.6 (H) 07/18/2022   LYMPHSABS 0.6 (L) 07/14/2022   MONOABS 1.2 (H) 07/14/2022   EOSABS 0.0 07/14/2022   BASOSABS 0.1 07/14/2022     Last metabolic panel Lab Results  Component Value Date   NA 135 07/17/2022   K 4.1 07/17/2022   CL 97 (L) 07/17/2022   CO2 30 07/17/2022   BUN 47 (H) 07/17/2022   CREATININE  1.06 (H) 07/17/2022   GLUCOSE 114 (H) 07/17/2022   GFRNONAA 53 (L) 07/17/2022   CALCIUM 8.6 (L) 07/17/2022   PROT 6.0 (L) 07/16/2022   ALBUMIN 2.5 (L) 07/16/2022    LABGLOB 3.2 01/13/2017   BILITOT 0.6 07/16/2022   ALKPHOS 57 07/16/2022   AST 29 07/16/2022   ALT 13 07/16/2022   ANIONGAP 8 07/17/2022    GFR: Estimated Creatinine Clearance: 39.6 mL/min (A) (by C-G formula based on SCr of 1.06 mg/dL (H)).  No results found for this or any previous visit (from the past 240 hour(s)).    Radiology Studies: No results found.    LOS: 4 days    Jacquelin Hawking, MD [facility] 07/18/2022, 9:42 AM   If 7PM-7AM, please contact night-coverage [email or web address]

## 2022-07-18 NOTE — Plan of Care (Signed)

## 2022-07-19 DIAGNOSIS — I10 Essential (primary) hypertension: Secondary | ICD-10-CM | POA: Diagnosis not present

## 2022-07-19 DIAGNOSIS — E119 Type 2 diabetes mellitus without complications: Secondary | ICD-10-CM | POA: Diagnosis not present

## 2022-07-19 DIAGNOSIS — I5032 Chronic diastolic (congestive) heart failure: Secondary | ICD-10-CM | POA: Diagnosis not present

## 2022-07-19 DIAGNOSIS — S7292XA Unspecified fracture of left femur, initial encounter for closed fracture: Secondary | ICD-10-CM | POA: Diagnosis not present

## 2022-07-19 LAB — CBC
HCT: 27.8 % — ABNORMAL LOW (ref 36.0–46.0)
Hemoglobin: 9.2 g/dL — ABNORMAL LOW (ref 12.0–15.0)
MCH: 27.6 pg (ref 26.0–34.0)
MCHC: 33.1 g/dL (ref 30.0–36.0)
MCV: 83.5 fL (ref 80.0–100.0)
Platelets: 314 10*3/uL (ref 150–400)
RBC: 3.33 MIL/uL — ABNORMAL LOW (ref 3.87–5.11)
RDW: 15.4 % (ref 11.5–15.5)
WBC: 8 10*3/uL (ref 4.0–10.5)
nRBC: 0 % (ref 0.0–0.2)

## 2022-07-19 LAB — GLUCOSE, CAPILLARY: Glucose-Capillary: 135 mg/dL — ABNORMAL HIGH (ref 70–99)

## 2022-07-19 NOTE — Progress Notes (Addendum)
Physical Therapy Treatment Patient Details Name: Darlene Hernandez MRN: [id number] DOB: 1941-07-25 Today's Date: 07/19/2022   History of Present Illness 81 y/o female presented to ED on 07/14/22 after fall from w/c. Sustained L periprosthetic distal femur fx. S/p ORIF L distal femur. PMH: T2DM, CHF, HTN    PT Comments    Pt instructed in and performed therex and therapeutic activities with decreased tolerance and performance  2/2 pain, weakness, and decreased right knee flexion ROM. Pt unable to complete sit to stand with max assist and deferred lateral scoot transfer to chair due to max assist required to scoot EOB. Pt will continue to benefit from skilled, acute care physical therapy interventions to maximize her strength and current level of function. Plan to attempt slide board transfer next.  Recommendations for follow up therapy are one component of a multi-disciplinary discharge planning process, led by the attending physician.  Recommendations may be updated based on patient status, additional functional criteria and insurance authorization.  Follow Up Recommendations  Skilled nursing-short term rehab (<3 hours/day) Can patient physically be transported by private vehicle: No   Assistance Recommended at Discharge Frequent or constant Supervision/Assistance  Patient can return home with the following Two people to help with walking and/or transfers;A lot of help with bathing/dressing/bathroom;Assistance with cooking/housework;Assist for transportation   Equipment Recommendations  Hospital bed;Other (comment) (slideboard)    Recommendations for Other Services       Precautions / Restrictions Precautions Precautions: Fall Restrictions Weight Bearing Restrictions: Yes LLE Weight Bearing: Non weight bearing     Mobility  Bed Mobility Overal bed mobility: Needs Assistance Bed Mobility: Supine to Sit, Rolling, Sit to Supine Rolling: Mod assist   Supine to sit: Mod assist, HOB  elevated (with pad and bed rail) Sit to supine: Mod assist   General bed mobility comments: Pt required bed rail and sling pad to get OOB on R side. Pt required increased cues and time due to weakness and pain. Pt needing mod A for LE management upon returning to bed. Pt with significant difficulty with scooting EOB and needing max A and use of sling pad.    Transfers Overall transfer level: Needs assistance Equipment used:  (pt grabbing therapist's B elbows) Transfers: Sit to/from Stand Sit to Stand: From elevated surface, Max assist           General transfer comment: Pt unable to clear bed with sit to stand attempt. Pt with poor R knee flexion when sitting EOB and required blocking of R LE but still R leg sliding out in front. Pt also unable to completely maintain NWB status on L LE despite cues. Deferrred sit to stand thereafter. Attempted lateral scoot transfer next to drop arm recliner but deferrred due to safety concerns 2/2 level of assistance needed for scooting EOB. Cues provided throughout for sequencing and technique.    Ambulation/Gait                   Stairs             Wheelchair Mobility    Modified Rankin (Stroke Patients Only)       Balance Overall balance assessment: Needs assistance Sitting-balance support: Bilateral upper extremity supported, Feet supported Sitting balance-Leahy Scale: Fair     Standing balance support: Bilateral upper extremity supported, During functional activity Standing balance-Leahy Scale: Zero  Cognition Arousal/Alertness: Awake/alert Behavior During Therapy: WFL for tasks assessed/performed, Anxious Overall Cognitive Status: Within Functional Limits for tasks assessed                                 General Comments: Pt A and O x 4. Pt anxious 2/2 pain        Exercises General Exercises - Lower Extremity Long Arc Quad: Right, 10 reps, Seated Heel  Slides: Right, 5 reps, Seated (very limited ROM) Toe Raises: Right, Left (attempted several reps on each side but pt with poor ability 2/2 weakness, pain, and decreased motor control/planning)    General Comments General comments (skin integrity, edema, etc.): VSS on RA; rest breaks provided as pt fatigued.      Pertinent Vitals/Pain Pain Assessment Pain Assessment: 0-10 Pain Score: 6  Pain Location: LLE Pain Descriptors / Indicators: Discomfort, Operative site guarding Pain Intervention(s): Monitored during session, Limited activity within patient's tolerance, Patient requesting pain meds-RN notified    Home Living                          Prior Function            PT Goals (current goals can now be found in the care plan section) Acute Rehab PT Goals Patient Stated Goal: to get better PT Goal Formulation: With patient Time For Goal Achievement: 07/30/22 Potential to Achieve Goals: Fair Progress towards PT goals: Progressing toward goals (slow progress)    Frequency    Min 4X/week      PT Plan Current plan remains appropriate    Co-evaluation              AM-PAC PT "6 Clicks" Mobility   Outcome Measure  Help needed turning from your back to your side while in a flat bed without using bedrails?: A Lot Help needed moving from lying on your back to sitting on the side of a flat bed without using bedrails?: A Lot Help needed moving to and from a bed to a chair (including a wheelchair)?: Total Help needed standing up from a chair using your arms (e.g., wheelchair or bedside chair)?: Total Help needed to walk in hospital room?: Total Help needed climbing 3-5 steps with a railing? : Total 6 Click Score: 8    End of Session Equipment Utilized During Treatment: Gait belt Activity Tolerance: Patient limited by pain;Other (comment) (weakness) Patient left: in bed;with bed alarm set;with call bell/phone within reach;with family/visitor present Nurse  Communication: Mobility status;Patient requests pain meds PT Visit Diagnosis: Unsteadiness on feet (R26.81);Muscle weakness (generalized) (M62.81);Other abnormalities of gait and mobility (R26.89)     Time: 6294-7654 PT Time Calculation (min) (ACUTE ONLY): 24 min  Charges:  $Therapeutic Exercise: 8-22 mins $Therapeutic Activity: 8-22 mins                    Tana Coast, PT    [employer] 07/19/2022, 11:48 AM

## 2022-07-19 NOTE — TOC Progression Note (Addendum)
Transition of Care [facility]) - Progression Note    Patient Details  Name: Darlene Hernandez MRN: [id number] Date of Birth: 1941/02/17  [facility] CM/SW Contact  Lorri Frederick, LCSW Phone Number: 07/19/2022, 10:21 AM  Clinical Narrative:   CSW spoke with pt daughter [name] regarding bed offers.  She wants SNF in [place], CSW will reach out to [facility], who has not yet responded.  Need new PT note for insurance auth, PT notified.  1145: [facility] does offer bed.  CSW spoke with pt and daughter [name] in room, they accept [facility] offer.    PT note in, Auth request submitted in [place].   [id number] approved: [id number], [id number], 3 days: 8/21-8/23  Expected Discharge Plan: Skilled Nursing Facility Barriers to Discharge: Continued Medical Work up  Expected Discharge Plan and Services Expected Discharge Plan: Skilled Nursing Facility In-house Referral: Clinical Social Work Discharge Planning Services: CM Consult Post Acute Care Choice: Skilled Nursing Facility Living arrangements for the past 2 months: Single Family Home                           HH Arranged: PT, OT HH Agency: [facility] Date [facility] Agency Contacted: 07/18/22 Time HH Agency Contacted: [id number] Representative spoke with at [facility] Agency: Kandee Keen   Social Determinants of Health (SDOH) Interventions    Readmission Risk Interventions     No data to display

## 2022-07-19 NOTE — Discharge Instructions (Addendum)
Orthopaedic Trauma Service Discharge Instructions   General Discharge Instructions  Orthopaedic Injuries:  Left distal femur fracture treated with open reduction and internal fixation using plate and screws   WEIGHT BEARING STATUS: Nonweightbearing left leg   RANGE OF MOTION/ACTIVITY:unrestricted motion of left knee   Bone health: continue with vitamin d   Review the following resource for additional information regarding bone health  [email or web address].cy  Wound Care: daily wound care as needed starting now.  Ok to leave wounds open to the air   Discharge Wound Care Instructions  Do NOT apply any ointments, solutions or lotions to pin sites or surgical wounds.  These prevent needed drainage and even though solutions like hydrogen peroxide kill bacteria, they also damage cells lining the pin sites that help fight infection.  Applying lotions or ointments can keep the wounds moist and can cause them to breakdown and open up as well. This can increase the risk for infection. When in doubt call the office.  Surgical incisions should be dressed daily.  If any drainage is noted, use one layer of adaptic or Mepitel, then gauze, Kerlix, and an ace wrap.  [email or web address][employer]cz [email or web address]_[email or web address]  [email or web address]  These dressing supplies should be available at local medical supply stores ([facility] [employer], [facility], etc). They are not usually carried at places like [facility], [facility], [facility], etc  Once the incision is completely dry and without drainage, it may be left open to air out.  Showering may begin 36-48 hours later.  Cleaning gently with soap and water.  Traumatic wounds should be dressed daily as well.    One layer of adaptic, gauze,  Kerlix, then ace wrap.  The adaptic can be discontinued once the draining has ceased    If you have a wet to dry dressing: wet the gauze with saline the squeeze as much saline out so the gauze is moist (not soaking wet), place moistened gauze over wound, then place a dry gauze over the moist one, followed by Kerlix wrap, then ace wrap.  Diet: as you were eating previously.  Can use over the counter stool softeners and bowel preparations, such as Miralax, to help with bowel movements.  Narcotics can be constipating.  Be sure to drink plenty of fluids  PAIN MEDICATION USE AND EXPECTATIONS  You have likely been given narcotic medications to help control your pain.  After a traumatic event that results in an fracture (broken bone) with or without surgery, it is ok to use narcotic pain medications to help control one's pain.  We understand that everyone responds to pain differently and each individual patient will be evaluated on a regular basis for the continued need for narcotic medications. Ideally, narcotic medication use should last no more than 6-8 weeks (coinciding with fracture healing).   As a patient it is your responsibility as well to monitor narcotic medication use and report the amount and frequency you use these medications when you come to your office visit.   We would also advise that if you are using narcotic medications, you should take a dose prior to therapy to maximize you participation.  IF YOU ARE ON NARCOTIC MEDICATIONS IT IS NOT PERMISSIBLE TO OPERATE A MOTOR VEHICLE (MOTORCYCLE/CAR/TRUCK/MOPED) OR HEAVY MACHINERY DO NOT MIX NARCOTICS WITH OTHER CNS (CENTRAL NERVOUS SYSTEM) DEPRESSANTS SUCH AS ALCOHOL   POST-OPERATIVE OPIOID TAPER INSTRUCTIONS: It is important to wean off of your opioid medication as soon as possible. If you do not need pain medication after your surgery it is ok  to stop day one. Opioids include: Codeine, Hydrocodone(Norco, Vicodin), Oxycodone(Percocet,  oxycontin) and hydromorphone amongst others.  Long term and even short term use of opiods can cause: Increased pain response Dependence Constipation Depression Respiratory depression And more.  Withdrawal symptoms can include Flu like symptoms Nausea, vomiting And more Techniques to manage these symptoms Hydrate well Eat regular healthy meals Stay active Use relaxation techniques(deep breathing, meditating, yoga) Do Not substitute Alcohol to help with tapering If you have been on opioids for less than two weeks and do not have pain than it is ok to stop all together.  Plan to wean off of opioids This plan should start within one week post op of your fracture surgery  Maintain the same interval or time between taking each dose and first decrease the dose.  Cut the total daily intake of opioids by one tablet each day Next start to increase the time between doses. The last dose that should be eliminated is the evening dose.    STOP SMOKING OR USING NICOTINE PRODUCTS!!!!  As discussed nicotine severely impairs your body's ability to heal surgical and traumatic wounds but also impairs bone healing.  Wounds and bone heal by forming microscopic blood vessels (angiogenesis) and nicotine is a vasoconstrictor (essentially, shrinks blood vessels).  Therefore, if vasoconstriction occurs to these microscopic blood vessels they essentially disappear and are unable to deliver necessary nutrients to the healing tissue.  This is one modifiable factor that you can do to dramatically increase your chances of healing your injury.    (This means no smoking, no nicotine gum, patches, etc)  DO NOT USE NONSTEROIDAL ANTI-INFLAMMATORY DRUGS (NSAID'S)  Using products such as Advil (ibuprofen), Aleve (naproxen), Motrin (ibuprofen) for additional pain control during fracture healing can delay and/or prevent the healing response.  If you would like to take over the counter (OTC) medication, Tylenol (acetaminophen)  is ok.  However, some narcotic medications that are given for pain control contain acetaminophen as well. Therefore, you should not exceed more than 4000 mg of tylenol in a day if you do not have liver disease.  Also note that there are may OTC medicines, such as cold medicines and allergy medicines that my contain tylenol as well.  If you have any questions about medications and/or interactions please ask your doctor/PA or your pharmacist.      ICE AND ELEVATE INJURED/OPERATIVE EXTREMITY  Using ice and elevating the injured extremity above your heart can help with swelling and pain control.  Icing in a pulsatile fashion, such as 20 minutes on and 20 minutes off, can be followed.    Do not place ice directly on skin. Make sure there is a barrier between to skin and the ice pack.    Using frozen items such as frozen peas works well as the conform nicely to the are that needs to be iced.  USE AN ACE WRAP OR TED HOSE FOR SWELLING CONTROL  In addition to icing and elevation, Ace wraps or TED hose are used to help limit and resolve swelling.  It is recommended to use Ace wraps or TED hose until you are informed to stop.    When using Ace Wraps start the wrapping distally (farthest away from the body) and wrap proximally (closer to the body)   Example: If you had surgery on your leg or thing and you do not have a splint on, start the ace wrap at the toes and work your way up to the thigh  If you had surgery on your upper extremity and do not have a splint on, start the ace wrap at your fingers and work your way up to the upper arm  IF YOU ARE IN A SPLINT OR CAST DO NOT REMOVE IT FOR ANY REASON   If your splint gets wet for any reason please contact the office immediately. You may shower in your splint or cast as long as you keep it dry.  This can be done by wrapping in a cast cover or garbage back (or similar)  Do Not stick any thing down your splint or cast such as pencils, money, or hangers to try  and scratch yourself with.  If you feel itchy take benadryl as prescribed on the bottle for itching  IF YOU ARE IN A CAM BOOT (BLACK BOOT)  You may remove boot periodically. Perform daily dressing changes as noted below.  Wash the liner of the boot regularly and wear a sock when wearing the boot. It is recommended that you sleep in the boot until told otherwise    Call office for the following: Temperature greater than 101F Persistent nausea and vomiting Severe uncontrolled pain Redness, tenderness, or signs of infection (pain, swelling, redness, odor or green/yellow discharge around the site) Difficulty breathing, headache or visual disturbances Hives Persistent dizziness or light-headedness Extreme fatigue Any other questions or concerns you may have after discharge  In an emergency, call 911 or go to an Emergency Department at a nearby hospital  HELPFUL INFORMATION  If you had a block, it will wear off between 8-24 hrs postop typically.  This is period when your pain may go from nearly zero to the pain you would have had postop without the block.  This is an abrupt transition but nothing dangerous is happening.  You may take an extra dose of narcotic when this happens.  You should wean off your narcotic medicines as soon as you are able.  Most patients will be off or using minimal narcotics before their first postop appointment.   We suggest you use the pain medication the first night prior to going to bed, in order to ease any pain when the anesthesia wears off. You should avoid taking pain medications on an empty stomach as it will make you nauseous.  Do not drink alcoholic beverages or take illicit drugs when taking pain medications.  In most states it is against the law to drive while you are in a splint or sling.  And certainly against the law to drive while taking narcotics.  You may return to work/school in the next couple of days when you feel up to it.   Pain medication  may make you constipated.  Below are a few solutions to try in this order: Decrease the amount of pain medication if you aren't having pain. Drink lots of decaffeinated fluids. Drink prune juice and/or each dried prunes  If the first 3 don't work start with additional solutions Take Colace - an over-the-counter stool softener Take Senokot - an over-the-counter laxative Take Miralax - a stronger over-the-counter laxative     CALL THE OFFICE WITH ANY QUESTIONS OR CONCERNS: [phone]   VISIT OUR WEBSITE FOR ADDITIONAL INFORMATION: [email or web address]

## 2022-07-19 NOTE — Plan of Care (Signed)

## 2022-07-19 NOTE — Progress Notes (Signed)
PROGRESS NOTE    [name]  SWF:[id number] DOB: 10-08-1941 DOA: 07/14/2022 PCP: Lucianne Lei, MD   Brief Narrative: Darlene Hernandez  is a 81 y.o. female with a history of chronic diastolic heart failure, diabetes mellitus, hypertension, hyperthyroidism. Patient presented after suffering a fall and subsequent left distal femur fracture. PT/OT recommending SNF.  Assessment and Plan:  Left distal femur fracture Secondary to fall from wheelchair. Orthopedic surgery consulted and performed ORIF on 8/17. Orthopedic surgery recommendations for RLE non-weightbearing, unrestricted movement of left knee. PT/OT recommending SNF.  Polyneuropathy Patient is non-ambulatory. She uses a wheelchair for mobility but transfers on her own.  Chronic diastolic heart failure Stable. On Lasix and valsartan as an outpatient as an outpatient. No evidence of fluid overload. -Daily weights  Acute anemia Secondary to fracture and likely blood loss. Hemoglobin of 11.9 on admission which has drifted down. Stable with hemoglobin of 9.2.  Diabetes mellitus, type 2 Patient is on diet control only; no medication management. Most recent hemoglobin A1c of 5.4%. -Continue carb modified diet  Primary hypertension Patient is on valsartan and hydrochlorothiazide as an outpatient. Patient with hypotension which responded to IV fluids. Antihypertensives discontinued. -Continue to hold antihypertensive medication  Hyperthyroidism Previously managed on methimazole and now not on treatment.  Hyperlipidemia -Continue Lipitor  PAD -Continue Pletal  Hypokalemia Potassium low of 2.9 on BMP. Potassium now back to baseline with repletion.  Leukocytosis Likely reactive and secondary to trauma. Trending down.  COPD -Continue Singulair, Breo and Duoneb  History of DVT Will need to confirm Xarelto dosing and indication prior to restarting. Okay to restart per orthopedic surgery recommendations  Pressure  injury Present on admission. Located sacrally. Wound care consulted on admission.   DVT prophylaxis: Xarelto Code Status:   Code Status: DNR Family Communication: None at bedside Disposition Plan: Discharge to SNF when hemoglobin stabilizes.   Consultants:  Orthopedic surgery  Procedures:  ORIF left distal femur (8/17)  Antimicrobials: None    Subjective: Patient without issues overnight.  Objective: BP 135/62   Pulse (!) 102   Temp 97.9 F (36.6 C)   Resp 20   Ht 5\' 6"  (1.676 m)   Wt 66.9 kg   SpO2 91%   BMI 23.81 kg/m   Examination:  General exam: Appears calm and comfortable Respiratory system: Clear to auscultation. Respiratory effort normal. Cardiovascular system: S1 & S2 heard, RRR. Gastrointestinal system: Abdomen is nondistended, soft and nontender. Normal bowel sounds heard. Central nervous system: Alert and oriented. No focal neurological deficits. Psychiatry: Judgement and insight appear normal. Mood & affect appropriate.    Data Reviewed: I have personally reviewed following labs and imaging studies  CBC Lab Results  Component Value Date   WBC 8.0 07/19/2022   RBC 3.33 (L) 07/19/2022   HGB 9.2 (L) 07/19/2022   HCT 27.8 (L) 07/19/2022   MCV 83.5 07/19/2022   MCH 27.6 07/19/2022   PLT 314 07/19/2022   MCHC 33.1 07/19/2022   RDW 15.4 07/19/2022   LYMPHSABS 0.6 (L) 07/14/2022   MONOABS 1.2 (H) 07/14/2022   EOSABS 0.0 07/14/2022   BASOSABS 0.1 07/14/2022     Last metabolic panel Lab Results  Component Value Date   NA 135 07/17/2022   K 4.1 07/17/2022   CL 97 (L) 07/17/2022   CO2 30 07/17/2022   BUN 47 (H) 07/17/2022   CREATININE 1.06 (H) 07/17/2022   GLUCOSE 114 (H) 07/17/2022   GFRNONAA 53 (L) 07/17/2022   CALCIUM 8.6 (L) 07/17/2022  PROT 6.0 (L) 07/16/2022   ALBUMIN 2.5 (L) 07/16/2022   LABGLOB 3.2 01/13/2017   BILITOT 0.6 07/16/2022   ALKPHOS 57 07/16/2022   AST 29 07/16/2022   ALT 13 07/16/2022   ANIONGAP 8 07/17/2022     GFR: Estimated Creatinine Clearance: 39.6 mL/min (A) (by C-G formula based on SCr of 1.06 mg/dL (H)).  No results found for this or any previous visit (from the past 240 hour(s)).    Radiology Studies: No results found.    LOS: 5 days    Jacquelin Hawking, MD [facility] 07/19/2022, 12:41 PM   If 7PM-7AM, please contact night-coverage [email or web address]

## 2022-07-20 DIAGNOSIS — F5102 Adjustment insomnia: Secondary | ICD-10-CM | POA: Diagnosis not present

## 2022-07-20 DIAGNOSIS — D649 Anemia, unspecified: Secondary | ICD-10-CM | POA: Diagnosis not present

## 2022-07-20 DIAGNOSIS — R262 Difficulty in walking, not elsewhere classified: Secondary | ICD-10-CM | POA: Diagnosis not present

## 2022-07-20 DIAGNOSIS — I5032 Chronic diastolic (congestive) heart failure: Secondary | ICD-10-CM | POA: Diagnosis not present

## 2022-07-20 DIAGNOSIS — Z7401 Bed confinement status: Secondary | ICD-10-CM | POA: Diagnosis not present

## 2022-07-20 DIAGNOSIS — L89626 Pressure-induced deep tissue damage of left heel: Secondary | ICD-10-CM | POA: Diagnosis not present

## 2022-07-20 DIAGNOSIS — E119 Type 2 diabetes mellitus without complications: Secondary | ICD-10-CM | POA: Diagnosis not present

## 2022-07-20 DIAGNOSIS — S72452D Displaced supracondylar fracture without intracondylar extension of lower end of left femur, subsequent encounter for closed fracture with routine healing: Secondary | ICD-10-CM | POA: Diagnosis not present

## 2022-07-20 DIAGNOSIS — S7292XD Unspecified fracture of left femur, subsequent encounter for closed fracture with routine healing: Secondary | ICD-10-CM | POA: Diagnosis not present

## 2022-07-20 DIAGNOSIS — L89312 Pressure ulcer of right buttock, stage 2: Secondary | ICD-10-CM | POA: Diagnosis not present

## 2022-07-20 DIAGNOSIS — S7292XA Unspecified fracture of left femur, initial encounter for closed fracture: Secondary | ICD-10-CM | POA: Diagnosis not present

## 2022-07-20 DIAGNOSIS — G8918 Other acute postprocedural pain: Secondary | ICD-10-CM | POA: Diagnosis not present

## 2022-07-20 DIAGNOSIS — F32A Depression, unspecified: Secondary | ICD-10-CM | POA: Diagnosis not present

## 2022-07-20 DIAGNOSIS — G629 Polyneuropathy, unspecified: Secondary | ICD-10-CM | POA: Diagnosis not present

## 2022-07-20 DIAGNOSIS — S72402D Unspecified fracture of lower end of left femur, subsequent encounter for closed fracture with routine healing: Secondary | ICD-10-CM | POA: Diagnosis not present

## 2022-07-20 DIAGNOSIS — I10 Essential (primary) hypertension: Secondary | ICD-10-CM | POA: Diagnosis not present

## 2022-07-20 LAB — GLUCOSE, CAPILLARY
Glucose-Capillary: 118 mg/dL — ABNORMAL HIGH (ref 70–99)
Glucose-Capillary: 93 mg/dL (ref 70–99)
Glucose-Capillary: 140 mg/dL — ABNORMAL HIGH (ref 70–99)

## 2022-07-20 MED ORDER — ACETAMINOPHEN 500 MG PO TABS: 500.0000 mg | ORAL_TABLET | Freq: Three times a day (TID) | ORAL | 0 refills | Status: DC | PRN

## 2022-07-20 MED ORDER — ACETAMINOPHEN 500 MG PO TABS: 500.0000 mg | ORAL_TABLET | Freq: Three times a day (TID) | ORAL | 0 refills | Status: AC | PRN

## 2022-07-20 MED ORDER — OXYCODONE HCL 5 MG PO TABS
5.0000 mg | ORAL_TABLET | Freq: Three times a day (TID) | ORAL | 0 refills | Status: AC | PRN
Start: 2022-07-20 — End: ?

## 2022-07-20 MED ORDER — OXYCODONE HCL 5 MG PO TABS: 5.0000 mg | ORAL_TABLET | Freq: Three times a day (TID) | ORAL | 0 refills | Status: DC | PRN

## 2022-07-20 NOTE — TOC Transition Note (Signed)
Transition of Care [facility]) - CM/SW Discharge Note   Patient Details  Name: Darlene Hernandez MRN: [id number] Date of Birth: [birth year]  [facility]) CM/SW Contact:  Lorri Frederick, LCSW Phone Number: 07/20/2022, 1:04 PM   Clinical Narrative:   Pt discharging to [facility] room 705.  RN call [phone] for report.     Final next level of care: Skilled Nursing Facility Barriers to Discharge: Barriers Resolved   Patient Goals and CMS Choice Patient states their goals for this hospitalization and ongoing recovery are:: first skilled rehab thne back home with daughter and son in law CMS Medicare.gov Compare Post Acute Care list provided to:: Patient Represenative (must comment) [name] (daughter)) Choice offered to / list presented to : Adult Children  Discharge Placement              Patient chooses bed at:  ([facility]) Patient to be transferred to facility by: [facility] Name of family member notified: daughter [name] Patient and family notified of of transfer: 07/20/22  Discharge Plan and Services In-house Referral: Clinical Social Work Discharge Planning Services: CM Consult Post Acute Care Choice: Skilled Nursing Facility                    HH Arranged: PT, OT [facility] Agency: [facility] Date [facility] Agency Contacted: 07/18/22 Time HH Agency Contacted: [id number] Representative spoke with at [facility] Agency: Kandee Keen  Social Determinants of Health (SDOH) Interventions     Readmission Risk Interventions     No data to display

## 2022-07-20 NOTE — Plan of Care (Signed)

## 2022-07-20 NOTE — Hospital Course (Signed)
Darlene Hernandez  is a 81 y.o. female with a history of chronic diastolic heart failure, diabetes mellitus, hypertension, hyperthyroidism. Patient presented after suffering a fall and subsequent left distal femur fracture. PT/OT recommending SNF.

## 2022-07-20 NOTE — Discharge Summary (Signed)
Physician Discharge Summary   Patient: Darlene Hernandez MRN: [id number] DOB: Apr 10, 1941  Admit date:     07/14/2022  Discharge date: 07/20/22  Discharge Physician: Jacquelin Hawking, MD   PCP: Lucianne Lei, MD   Recommendations at discharge:  Follow-up with orthopedic surgery in 2 weeks  Discharge Diagnoses: Principal Problem:   Femur fracture (HCC) Active Problems:   Polyneuropathy   Chronic diastolic CHF (congestive heart failure) (HCC)   Diabetes mellitus without complication (HCC)   Hypertension   Thyroid disease   DNR (do not resuscitate)   Pressure injury of skin  Resolved Problems:   * No resolved hospital problems. *  Hospital Course: Darlene Hernandez  is a 81 y.o. female with a history of chronic diastolic heart failure, diabetes mellitus, hypertension, hyperthyroidism. Patient presented after suffering a fall and subsequent left distal femur fracture. PT/OT recommending SNF.  Assessment and Plan:  Left distal femur fracture Secondary to fall from wheelchair. Orthopedic surgery consulted and performed ORIF on 8/17. Orthopedic surgery recommendations for RLE non-weightbearing, unrestricted movement of left knee. PT/OT recommending SNF.   Polyneuropathy Patient is non-ambulatory. She uses a wheelchair for mobility but transfers on her own.   Chronic diastolic heart failure Stable. On Lasix and valsartan as an outpatient as an outpatient. No evidence of fluid overload. Valsartan-hydrochlorothiazide held on discharge secondary to normotensive blood pressure.   Acute anemia Secondary to fracture and likely blood loss. Hemoglobin of 11.9 on admission which has drifted down. Stable with hemoglobin of 9.2. No transfusion required.   Diabetes mellitus, type 2 Patient is on diet control only; no medication management. Most recent hemoglobin A1c of 5.4%. Continue carb modified diet.   Primary hypertension Patient is on valsartan and hydrochlorothiazide as an outpatient. Patient with  hypotension which responded to IV fluids. Antihypertensives discontinued. Continue to hold valsartan-hydrochlorothiazide on discharge. Restart as needed.   Hyperthyroidism Previously managed on methimazole and now not on treatment.   Hyperlipidemia Continue Lipitor   PAD Continue Pletal   Hypokalemia Potassium low of 2.9 on BMP. Potassium now back to baseline with repletion.   Leukocytosis Likely reactive and secondary to trauma. Trending down.   COPD Continue Singulair, Breo and Duoneb   History of DVT Will need to confirm Xarelto dosing and indication prior to restarting. Okay to restart per orthopedic surgery recommendations   Pressure injury Present on admission. Located sacrally. Wound care consulted on admission.   Consultants: Orthopedic surgery Procedures performed: ORIF left distal femur (8/17)  Disposition: Skilled nursing facility Diet recommendation: Carb modified/heart healthy  DISCHARGE MEDICATION: Allergies as of 07/20/2022       Reactions   Penicillins Anaphylaxis   Sulfa Antibiotics Anaphylaxis   Prednisone Other (See Comments)   "drives me up the wall and out of my mind"        Medication List     STOP taking these medications    doxycycline 100 MG capsule Commonly known as: VIBRAMYCIN   traMADol 50 MG tablet Commonly known as: ULTRAM   valsartan-hydrochlorothiazide 320-25 MG tablet Commonly known as: DIOVAN-HCT       TAKE these medications    acetaminophen 500 MG tablet Commonly known as: TYLENOL Take 1-2 tablets (500-1,000 mg total) by mouth every 8 (eight) hours as needed for mild pain or moderate pain.   atorvastatin 20 MG tablet Commonly known as: LIPITOR Take by mouth.   baclofen 10 MG tablet Commonly known as: LIORESAL Take 10 mg by mouth daily.   Breo Ellipta 100-25 MCG/INH Aepb Generic  drug: fluticasone furoate-vilanterol Inhale into the lungs.   cilostazol 50 MG tablet Commonly known as: PLETAL Take by  mouth.   FeroSul 325 (65 FE) MG tablet Generic drug: ferrous sulfate Take 325 mg by mouth daily.   furosemide 40 MG tablet Commonly known as: LASIX   ipratropium-albuterol 0.5-2.5 (3) MG/3ML Soln Commonly known as: DUONEB Take 3 mLs by nebulization 4 (four) times daily.   montelukast 10 MG tablet Commonly known as: SINGULAIR Take 10 mg by mouth daily.   oxyCODONE 5 MG immediate release tablet Commonly known as: Oxy IR/ROXICODONE Take 1 tablet (5 mg total) by mouth every 8 (eight) hours as needed for severe pain ((for MODERATE breakthrough pain)).   pantoprazole 40 MG tablet Commonly known as: PROTONIX Take 40 mg by mouth daily.   potassium chloride SA 20 MEQ tablet Commonly known as: KLOR-CON M   sertraline 50 MG tablet Commonly known as: ZOLOFT Take 50 mg by mouth daily.   traZODone 50 MG tablet Commonly known as: DESYREL Take 25-50 mg by mouth at bedtime as needed.   Vitamin D3 1.25 MG (50000 UT) Caps Take 1 capsule by mouth once a week. What changed: Another medication with the same name was removed. Continue taking this medication, and follow the directions you see here.   Xarelto 15 MG Tabs tablet Generic drug: Rivaroxaban Take 15 mg by mouth 2 (two) times daily.        Contact information for follow-up providers     Care, [facility] Follow up.   Specialty: Home Health Services Why: someone from the office will call to schedule home health visits Contact information: [address] [place] [address] [phone]         Myrene Galas, MD. Schedule an appointment as soon as possible for a visit in 2 week(s).   Specialty: Orthopedic Surgery Contact information: [address] [place] [address] [phone]              Contact information for after-discharge care     Destination     [facility] .   Service: Skilled Nursing Contact information: [address] [place]  [place] [address] [phone]                    Discharge Exam: BP 122/71 (BP Location: Left Arm)   Pulse 67   Temp 98.2 F (36.8 C) (Oral)   Resp 16   Ht 5\' 6"  (1.676 m)   Wt 66.9 kg   SpO2 95%   BMI 23.81 kg/m   General exam: Appears calm and comfortable Respiratory system: Respiratory effort normal. Gastrointestinal system: Abdomen is non-distended   Condition at discharge: stable  The results of significant diagnostics from this hospitalization (including imaging, microbiology, ancillary and laboratory) are listed below for reference.   Imaging Studies: DG Knee Left Port  Result Date: 07/15/2022 CLINICAL DATA:  ORIF left femur fracture EXAM: PORTABLE LEFT KNEE - 1-2 VIEW COMPARISON:  07/14/2022 FINDINGS: Interval ORIF of distal femur fracture. Lateral plate and screws are present across the fracture. Mild fracture displacement best seen on the lateral view. Left knee replacement in satisfactory position and alignment. Arterial calcification IMPRESSION: Lateral plate and screw fixation of supracondylar fracture left femur. Left knee replacement Electronically Signed   By: 07/16/2022 M.D.   On: 07/15/2022 15:53   DG FEMUR MIN 2 VIEWS LEFT  Result Date: 07/15/2022 CLINICAL DATA:  Open reduction EXAM: LEFT FEMUR 2  VIEWS COMPARISON:  Plain film of 1 day prior FINDINGS: Five intraoperative images demonstrate placement of a lateral plate and screw fixation device across the previously described distal femur fracture with improved alignment and no acute hardware complication. Left knee arthroplasty is incompletely imaged. Residual lateral and posterior displacement of distal fracture fragment. IMPRESSION: Intraoperative imaging of femur fixation. Electronically Signed   By: Abigail Miyamoto M.D.   On: 07/15/2022 13:24   DG C-Arm 1-60 Min-No Report  Result Date: 07/15/2022 Fluoroscopy was utilized by the requesting physician.  No radiographic interpretation.   DG C-Arm 1-60  Min-No Report  Result Date: 07/15/2022 Fluoroscopy was utilized by the requesting physician.  No radiographic interpretation.   DG Shoulder Left  Result Date: 07/14/2022 CLINICAL DATA:  Patient arrives from home, head mechanical fall. 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 There are subchondral cystic changes and osteophytes about the humeral head. There is osteolysis and fragmentation of the distal clavicle. IMPRESSION: 1. Advanced degenerative changes of the left shoulder with osseous remodeling of the glenoid and osteolysis and fragmentation of the distal clavicle. 2.  No definite evidence of acute fracture. Electronically Signed   By: Keane Police D.O.   On: 07/14/2022 16:47   CT Head Wo Contrast  Result Date: 07/14/2022 CLINICAL DATA:  Fall, trauma EXAM: CT HEAD WITHOUT CONTRAST CT MAXILLOFACIAL WITHOUT CONTRAST CT CERVICAL SPINE WITHOUT CONTRAST TECHNIQUE: Multidetector CT imaging of the head, cervical spine, and maxillofacial structures were performed using the standard protocol without intravenous contrast. Multiplanar CT image reconstructions of the cervical spine and maxillofacial structures were also generated. RADIATION DOSE REDUCTION: This exam was performed according to the departmental dose-optimization program which includes automated exposure control, adjustment of the mA and/or kV according to patient size and/or use of iterative reconstruction technique. COMPARISON:  None Available. FINDINGS: CT HEAD FINDINGS Brain: No evidence of acute infarction, hemorrhage, hydrocephalus, extra-axial collection or mass lesion/mass effect. Extensive periventricular and deep white matter hypodensity. Vascular: No hyperdense vessel or unexpected calcification. CT FACIAL BONES FINDINGS Skull: Normal. Negative for fracture or focal lesion.  Facial bones: No displaced fractures or dislocations. Sinuses/Orbits: No acute finding. Other: Soft tissue contusion of the left cheek and chin. CT CERVICAL SPINE FINDINGS Alignment: Normal. Skull base and vertebrae: No acute fracture. No primary bone lesion or focal pathologic process. Soft tissues and spinal canal: No prevertebral fluid or swelling. No visible canal hematoma. Disc levels: Moderate multilevel cervical disc degenerative disease and osteophytosis, worst from C3 through C6. Upper chest: Emphysema. Other: None. IMPRESSION: 1. No acute intracranial pathology. Small-vessel white matter disease. 2. No displaced fractures or dislocations of the facial bones. Soft tissue contusion of the left cheek and chin. 3. No fracture or subluxation of the cervical spine. Moderate multilevel cervical disc degenerative disease. 4. Emphysema. Emphysema (ICD10-J43.9). Electronically Signed   By: Delanna Ahmadi M.D.   On: 07/14/2022 15:01   CT Maxillofacial Wo Contrast  Result Date: 07/14/2022 CLINICAL DATA:  Fall, trauma EXAM: CT HEAD WITHOUT CONTRAST CT MAXILLOFACIAL WITHOUT CONTRAST CT CERVICAL SPINE WITHOUT CONTRAST TECHNIQUE: Multidetector CT imaging of the head, cervical spine, and maxillofacial structures were performed using the standard protocol without intravenous contrast. Multiplanar CT image reconstructions of the cervical spine and maxillofacial structures were also generated. RADIATION DOSE REDUCTION: This exam was performed according to the departmental dose-optimization program which includes automated exposure control,  adjustment of the mA and/or kV according to patient size and/or use of iterative reconstruction technique. COMPARISON:  None Available. FINDINGS: CT HEAD FINDINGS Brain: No evidence of acute infarction, hemorrhage, hydrocephalus, extra-axial collection or mass lesion/mass effect. Extensive periventricular and deep white matter hypodensity. Vascular: No hyperdense vessel or unexpected  calcification. CT FACIAL BONES FINDINGS Skull: Normal. Negative for fracture or focal lesion. Facial bones: No displaced fractures or dislocations. Sinuses/Orbits: No acute finding. Other: Soft tissue contusion of the left cheek and chin. CT CERVICAL SPINE FINDINGS Alignment: Normal. Skull base and vertebrae: No acute fracture. No primary bone lesion or focal pathologic process. Soft tissues and spinal canal: No prevertebral fluid or swelling. No visible canal hematoma. Disc levels: Moderate multilevel cervical disc degenerative disease and osteophytosis, worst from C3 through C6. Upper chest: Emphysema. Other: None. IMPRESSION: 1. No acute intracranial pathology. Small-vessel white matter disease. 2. No displaced fractures or dislocations of the facial bones. Soft tissue contusion of the left cheek and chin. 3. No fracture or subluxation of the cervical spine. Moderate multilevel cervical disc degenerative disease. 4. Emphysema. Emphysema (ICD10-J43.9). Electronically Signed   By: Delanna Ahmadi M.D.   On: 07/14/2022 15:01   CT Cervical Spine Wo Contrast  Result Date: 07/14/2022 CLINICAL DATA:  Fall, trauma EXAM: CT HEAD WITHOUT CONTRAST CT MAXILLOFACIAL WITHOUT CONTRAST CT CERVICAL SPINE WITHOUT CONTRAST TECHNIQUE: Multidetector CT imaging of the head, cervical spine, and maxillofacial structures were performed using the standard protocol without intravenous contrast. Multiplanar CT image reconstructions of the cervical spine and maxillofacial structures were also generated. RADIATION DOSE REDUCTION: This exam was performed according to the departmental dose-optimization program which includes automated exposure control, adjustment of the mA and/or kV according to patient size and/or use of iterative reconstruction technique. COMPARISON:  None Available. FINDINGS: CT HEAD FINDINGS Brain: No evidence of acute infarction, hemorrhage, hydrocephalus, extra-axial collection or mass lesion/mass effect. Extensive  periventricular and deep white matter hypodensity. Vascular: No hyperdense vessel or unexpected calcification. CT FACIAL BONES FINDINGS Skull: Normal. Negative for fracture or focal lesion. Facial bones: No displaced fractures or dislocations. Sinuses/Orbits: No acute finding. Other: Soft tissue contusion of the left cheek and chin. CT CERVICAL SPINE FINDINGS Alignment: Normal. Skull base and vertebrae: No acute fracture. No primary bone lesion or focal pathologic process. Soft tissues and spinal canal: No prevertebral fluid or swelling. No visible canal hematoma. Disc levels: Moderate multilevel cervical disc degenerative disease and osteophytosis, worst from C3 through C6. Upper chest: Emphysema. Other: None. IMPRESSION: 1. No acute intracranial pathology. Small-vessel white matter disease. 2. No displaced fractures or dislocations of the facial bones. Soft tissue contusion of the left cheek and chin. 3. No fracture or subluxation of the cervical spine. Moderate multilevel cervical disc degenerative disease. 4. Emphysema. Emphysema (ICD10-J43.9). Electronically Signed   By: Delanna Ahmadi M.D.   On: 07/14/2022 15:01   DG Chest 1 View  Result Date: 07/14/2022 CLINICAL DATA:  Clinical fall at home, LEFT lower extremity pain EXAM: CHEST  1 VIEW COMPARISON:  01/12/2021 FINDINGS: Normal heart size and pulmonary vascularity. Atherosclerotic calcification aorta. Large hiatal hernia. LEFT basilar atelectasis. Chronic accentuation of pulmonary markings without acute infiltrate, pleural effusion, or pneumothorax. BILATERAL glenohumeral degenerative changes. IMPRESSION: Large hiatal hernia with LEFT basilar atelectasis. Aortic Atherosclerosis (ICD10-I70.0). Electronically Signed   By: Lavonia Dana M.D.   On: 07/14/2022 14:56   DG Knee Complete 4 Views Left  Result Date: 07/14/2022 CLINICAL DATA:  Mechanical fall, LEFT lower extremity pain EXAM: LEFT KNEE -  COMPLETE 4+ VIEW COMPARISON:  None FINDINGS: Osseous  demineralization. Components of LEFT knee prosthesis. Mildly displaced fracture distal LEFT femoral metadiaphysis extending to prosthesis. No dislocation. Visualized tibia and fibula appear intact. Question minimal joint effusion. Scattered atherosclerotic calcifications. IMPRESSION: Mildly displaced distal LEFT femoral metadiaphyseal fracture extending to knee prosthesis. Question minimal joint effusion. Electronically Signed   By: Lavonia Dana M.D.   On: 07/14/2022 14:55   DG Pelvis 1-2 Views  Result Date: 07/14/2022 CLINICAL DATA:  Mid ankle fall, LEFT lower extremity pain EXAM: PELVIS - 1-2 VIEW COMPARISON:  None FINDINGS: Osseous demineralization. Hip and SI joint spaces preserved. Extensive deformities of the RIGHT hemipelvis from old superior and inferior pubic rami fractures. Advanced degenerative disc and facet disease changes lumbar spine with dextroconvex scoliosis. No acute fracture, dislocation, or bone destruction. Question prior hernia repair upper RIGHT pelvis. IMPRESSION:.: IMPRESSION:. Degenerative disc and facet disease changes lumbar spine with dextroconvex scoliosis. Osseous demineralization with old posttraumatic deformities of LEFT hemipelvis. No acute osseous abnormalities Electronically Signed   By: Lavonia Dana M.D.   On: 07/14/2022 14:50    Microbiology: No results found for this or any previous visit.  Labs: CBC: Recent Labs  Lab 07/14/22 1352 07/15/22 0204 07/16/22 1229 07/17/22 0118 07/18/22 0306 07/19/22 0215  WBC 19.4* 18.6* 15.3* 11.6* 8.5 8.0  NEUTROABS 17.3*  --   --   --   --   --   HGB 11.9* 11.1* 9.5* 9.0* 8.6* 9.2*  HCT 36.5 34.2* 29.5* 27.7* 26.9* 27.8*  MCV 84.9 84.9 84.5 83.7 85.7 83.5  PLT 332 326 271 270 272 [id number]   Basic Metabolic Panel: Recent Labs  Lab 07/14/22 1352 07/15/22 0204 07/16/22 1229 07/17/22 0118  NA 138 137 133* 135  K 2.7* 3.1* 2.9* 4.1  CL 94* 95* 97* 97*  CO2 30 30 29 30   GLUCOSE 139* 150* 123* 114*  BUN 31* 29* 40* 47*   CREATININE 0.93 1.03* 1.21* 1.06*  CALCIUM 9.3 9.1 8.4* 8.6*  MG 1.7  --  1.8  --    Liver Function Tests: Recent Labs  Lab 07/16/22 1229  AST 29  ALT 13  ALKPHOS 57  BILITOT 0.6  PROT 6.0*  ALBUMIN 2.5*   CBG: Recent Labs  Lab 07/18/22 0642 07/19/22 0633 07/20/22 0733 07/20/22 0752 07/20/22 1206  GLUCAP 83 135* 93 118* 140*    Discharge time spent: 35 minutes.  Signed: Cordelia Poche, MD [facility] 07/20/2022

## 2022-07-20 NOTE — Progress Notes (Signed)
PT Cancellation Note  Patient Details Name: Darlene Hernandez MRN: [id number] DOB: 1941/02/11   Cancelled Treatment:    Reason Eval/Treat Not Completed: Patient declined, no reason specified;Other (comment) (Pt wants to rest prior to discharge.)  Tana Coast, PT   Tana Coast 07/20/2022, 1:51 PM

## 2022-07-21 DIAGNOSIS — L89626 Pressure-induced deep tissue damage of left heel: Secondary | ICD-10-CM | POA: Diagnosis not present

## 2022-07-21 DIAGNOSIS — L89312 Pressure ulcer of right buttock, stage 2: Secondary | ICD-10-CM | POA: Diagnosis not present

## 2022-07-23 DIAGNOSIS — R262 Difficulty in walking, not elsewhere classified: Secondary | ICD-10-CM | POA: Diagnosis not present

## 2022-07-23 DIAGNOSIS — S72402D Unspecified fracture of lower end of left femur, subsequent encounter for closed fracture with routine healing: Secondary | ICD-10-CM | POA: Diagnosis not present

## 2022-07-23 DIAGNOSIS — D649 Anemia, unspecified: Secondary | ICD-10-CM | POA: Diagnosis not present

## 2022-07-23 DIAGNOSIS — G8918 Other acute postprocedural pain: Secondary | ICD-10-CM | POA: Diagnosis not present

## 2022-07-28 DIAGNOSIS — L89312 Pressure ulcer of right buttock, stage 2: Secondary | ICD-10-CM | POA: Diagnosis not present

## 2022-07-28 DIAGNOSIS — L89626 Pressure-induced deep tissue damage of left heel: Secondary | ICD-10-CM | POA: Diagnosis not present

## 2022-08-04 DIAGNOSIS — S72452D Displaced supracondylar fracture without intracondylar extension of lower end of left femur, subsequent encounter for closed fracture with routine healing: Secondary | ICD-10-CM | POA: Diagnosis not present

## 2022-08-04 DIAGNOSIS — L89626 Pressure-induced deep tissue damage of left heel: Secondary | ICD-10-CM | POA: Diagnosis not present

## 2022-08-09 DIAGNOSIS — I83009 Varicose veins of unspecified lower extremity with ulcer of unspecified site: Secondary | ICD-10-CM | POA: Diagnosis not present

## 2022-08-09 DIAGNOSIS — J209 Acute bronchitis, unspecified: Secondary | ICD-10-CM | POA: Diagnosis not present

## 2022-08-09 DIAGNOSIS — Z09 Encounter for follow-up examination after completed treatment for conditions other than malignant neoplasm: Secondary | ICD-10-CM | POA: Diagnosis not present

## 2022-08-09 DIAGNOSIS — L97909 Non-pressure chronic ulcer of unspecified part of unspecified lower leg with unspecified severity: Secondary | ICD-10-CM | POA: Diagnosis not present

## 2022-08-09 DIAGNOSIS — R531 Weakness: Secondary | ICD-10-CM | POA: Diagnosis not present

## 2022-08-09 DIAGNOSIS — Z6830 Body mass index (BMI) 30.0-30.9, adult: Secondary | ICD-10-CM | POA: Diagnosis not present

## 2022-08-09 DIAGNOSIS — M7989 Other specified soft tissue disorders: Secondary | ICD-10-CM | POA: Diagnosis not present

## 2022-08-16 DIAGNOSIS — S72402D Unspecified fracture of lower end of left femur, subsequent encounter for closed fracture with routine healing: Secondary | ICD-10-CM | POA: Diagnosis not present

## 2022-08-27 DIAGNOSIS — M21371 Foot drop, right foot: Secondary | ICD-10-CM | POA: Diagnosis not present

## 2022-08-27 DIAGNOSIS — M21372 Foot drop, left foot: Secondary | ICD-10-CM | POA: Diagnosis not present

## 2022-08-27 DIAGNOSIS — M1990 Unspecified osteoarthritis, unspecified site: Secondary | ICD-10-CM | POA: Diagnosis not present

## 2022-08-27 DIAGNOSIS — L89152 Pressure ulcer of sacral region, stage 2: Secondary | ICD-10-CM | POA: Diagnosis not present

## 2022-08-30 DIAGNOSIS — Z7409 Other reduced mobility: Secondary | ICD-10-CM | POA: Diagnosis not present

## 2022-08-30 DIAGNOSIS — M7989 Other specified soft tissue disorders: Secondary | ICD-10-CM | POA: Diagnosis not present

## 2022-08-30 DIAGNOSIS — M79604 Pain in right leg: Secondary | ICD-10-CM | POA: Diagnosis not present

## 2022-09-28 DIAGNOSIS — J449 Chronic obstructive pulmonary disease, unspecified: Secondary | ICD-10-CM | POA: Diagnosis not present

## 2022-09-28 DIAGNOSIS — I10 Essential (primary) hypertension: Secondary | ICD-10-CM | POA: Diagnosis not present

## 2022-10-01 DIAGNOSIS — E559 Vitamin D deficiency, unspecified: Secondary | ICD-10-CM | POA: Diagnosis not present

## 2022-10-01 DIAGNOSIS — G4700 Insomnia, unspecified: Secondary | ICD-10-CM | POA: Diagnosis not present

## 2022-10-01 DIAGNOSIS — Z6830 Body mass index (BMI) 30.0-30.9, adult: Secondary | ICD-10-CM | POA: Diagnosis not present

## 2022-10-01 DIAGNOSIS — M1990 Unspecified osteoarthritis, unspecified site: Secondary | ICD-10-CM | POA: Diagnosis not present

## 2022-10-01 DIAGNOSIS — J449 Chronic obstructive pulmonary disease, unspecified: Secondary | ICD-10-CM | POA: Diagnosis not present

## 2022-10-01 DIAGNOSIS — M7989 Other specified soft tissue disorders: Secondary | ICD-10-CM | POA: Diagnosis not present

## 2022-11-01 DIAGNOSIS — K219 Gastro-esophageal reflux disease without esophagitis: Secondary | ICD-10-CM | POA: Diagnosis not present

## 2022-11-01 DIAGNOSIS — I10 Essential (primary) hypertension: Secondary | ICD-10-CM | POA: Diagnosis not present

## 2022-11-01 DIAGNOSIS — J309 Allergic rhinitis, unspecified: Secondary | ICD-10-CM | POA: Diagnosis not present

## 2022-11-01 DIAGNOSIS — M7989 Other specified soft tissue disorders: Secondary | ICD-10-CM | POA: Diagnosis not present

## 2022-11-01 DIAGNOSIS — Z6830 Body mass index (BMI) 30.0-30.9, adult: Secondary | ICD-10-CM | POA: Diagnosis not present

## 2022-11-08 DIAGNOSIS — K449 Diaphragmatic hernia without obstruction or gangrene: Secondary | ICD-10-CM | POA: Diagnosis not present

## 2022-11-08 DIAGNOSIS — B029 Zoster without complications: Secondary | ICD-10-CM | POA: Diagnosis not present

## 2022-11-08 DIAGNOSIS — R9082 White matter disease, unspecified: Secondary | ICD-10-CM | POA: Diagnosis not present

## 2022-11-08 DIAGNOSIS — N390 Urinary tract infection, site not specified: Secondary | ICD-10-CM | POA: Diagnosis not present

## 2022-11-08 DIAGNOSIS — M25552 Pain in left hip: Secondary | ICD-10-CM | POA: Diagnosis not present

## 2022-11-08 DIAGNOSIS — R9431 Abnormal electrocardiogram [ECG] [EKG]: Secondary | ICD-10-CM | POA: Diagnosis not present

## 2022-11-08 DIAGNOSIS — I10 Essential (primary) hypertension: Secondary | ICD-10-CM | POA: Diagnosis not present

## 2022-11-08 DIAGNOSIS — J9811 Atelectasis: Secondary | ICD-10-CM | POA: Diagnosis not present

## 2022-11-08 DIAGNOSIS — R21 Rash and other nonspecific skin eruption: Secondary | ICD-10-CM | POA: Diagnosis not present

## 2022-11-08 DIAGNOSIS — E876 Hypokalemia: Secondary | ICD-10-CM | POA: Diagnosis not present

## 2022-11-08 DIAGNOSIS — Z20822 Contact with and (suspected) exposure to covid-19: Secondary | ICD-10-CM | POA: Diagnosis not present

## 2022-11-09 DIAGNOSIS — R531 Weakness: Secondary | ICD-10-CM | POA: Diagnosis not present

## 2022-11-09 DIAGNOSIS — Z7401 Bed confinement status: Secondary | ICD-10-CM | POA: Diagnosis not present

## 2022-11-16 DIAGNOSIS — I83009 Varicose veins of unspecified lower extremity with ulcer of unspecified site: Secondary | ICD-10-CM | POA: Diagnosis not present

## 2022-11-16 DIAGNOSIS — Z09 Encounter for follow-up examination after completed treatment for conditions other than malignant neoplasm: Secondary | ICD-10-CM | POA: Diagnosis not present

## 2022-11-16 DIAGNOSIS — Z6830 Body mass index (BMI) 30.0-30.9, adult: Secondary | ICD-10-CM | POA: Diagnosis not present

## 2022-11-16 DIAGNOSIS — K219 Gastro-esophageal reflux disease without esophagitis: Secondary | ICD-10-CM | POA: Diagnosis not present

## 2022-11-16 DIAGNOSIS — Z7409 Other reduced mobility: Secondary | ICD-10-CM | POA: Diagnosis not present

## 2022-11-16 DIAGNOSIS — L97909 Non-pressure chronic ulcer of unspecified part of unspecified lower leg with unspecified severity: Secondary | ICD-10-CM | POA: Diagnosis not present

## 2022-11-24 DIAGNOSIS — E872 Acidosis, unspecified: Secondary | ICD-10-CM | POA: Diagnosis not present

## 2022-11-24 DIAGNOSIS — E162 Hypoglycemia, unspecified: Secondary | ICD-10-CM | POA: Diagnosis not present

## 2022-11-24 DIAGNOSIS — J9601 Acute respiratory failure with hypoxia: Secondary | ICD-10-CM | POA: Diagnosis not present

## 2022-11-24 DIAGNOSIS — Z681 Body mass index (BMI) 19 or less, adult: Secondary | ICD-10-CM | POA: Diagnosis not present

## 2022-11-24 DIAGNOSIS — J189 Pneumonia, unspecified organism: Secondary | ICD-10-CM | POA: Diagnosis not present

## 2022-11-24 DIAGNOSIS — J440 Chronic obstructive pulmonary disease with acute lower respiratory infection: Secondary | ICD-10-CM | POA: Diagnosis not present

## 2022-11-24 DIAGNOSIS — J159 Unspecified bacterial pneumonia: Secondary | ICD-10-CM | POA: Diagnosis not present

## 2022-11-24 DIAGNOSIS — R6521 Severe sepsis with septic shock: Secondary | ICD-10-CM | POA: Diagnosis not present

## 2022-11-24 DIAGNOSIS — E43 Unspecified severe protein-calorie malnutrition: Secondary | ICD-10-CM | POA: Diagnosis not present

## 2022-11-24 DIAGNOSIS — J1008 Influenza due to other identified influenza virus with other specified pneumonia: Secondary | ICD-10-CM | POA: Diagnosis not present

## 2022-11-24 DIAGNOSIS — R509 Fever, unspecified: Secondary | ICD-10-CM | POA: Diagnosis not present

## 2022-11-24 DIAGNOSIS — R9431 Abnormal electrocardiogram [ECG] [EKG]: Secondary | ICD-10-CM | POA: Diagnosis not present

## 2022-11-24 DIAGNOSIS — K449 Diaphragmatic hernia without obstruction or gangrene: Secondary | ICD-10-CM | POA: Diagnosis not present

## 2022-11-24 DIAGNOSIS — N3001 Acute cystitis with hematuria: Secondary | ICD-10-CM | POA: Diagnosis not present

## 2022-11-24 DIAGNOSIS — R531 Weakness: Secondary | ICD-10-CM | POA: Diagnosis not present

## 2022-11-24 DIAGNOSIS — A419 Sepsis, unspecified organism: Secondary | ICD-10-CM | POA: Diagnosis not present

## 2022-11-24 DIAGNOSIS — I10 Essential (primary) hypertension: Secondary | ICD-10-CM | POA: Diagnosis not present

## 2022-11-24 DIAGNOSIS — R0902 Hypoxemia: Secondary | ICD-10-CM | POA: Diagnosis not present

## 2022-11-24 DIAGNOSIS — E161 Other hypoglycemia: Secondary | ICD-10-CM | POA: Diagnosis not present

## 2022-12-30 DEATH — deceased
